# Patient Record
Sex: Female | Born: 1985 | Race: Black or African American | Hispanic: No | Marital: Married | Smoking: Never smoker
Health system: Southern US, Community
[De-identification: ages and names within clinical notes are randomized; demographics above are authoritative.]

## PROBLEM LIST (undated history)

## (undated) DIAGNOSIS — D649 Anemia, unspecified: Secondary | ICD-10-CM

## (undated) DIAGNOSIS — Z8759 Personal history of other complications of pregnancy, childbirth and the puerperium: Secondary | ICD-10-CM

## (undated) HISTORY — DX: Anemia, unspecified: D64.9

## (undated) HISTORY — DX: Personal history of other complications of pregnancy, childbirth and the puerperium: Z87.59

---

## 2011-09-24 DIAGNOSIS — O1490 Unspecified pre-eclampsia, unspecified trimester: Secondary | ICD-10-CM

## 2013-09-23 DIAGNOSIS — O1490 Unspecified pre-eclampsia, unspecified trimester: Secondary | ICD-10-CM

## 2013-09-23 DIAGNOSIS — O4443 Low lying placenta NOS or without hemorrhage, third trimester: Secondary | ICD-10-CM

## 2015-09-24 NOTE — L&D Delivery Note (Signed)
Delivery Summary for Allison Manning  Labor Events:   Preterm labor:   Rupture date:   Rupture time:   Rupture type: Intact  Fluid Color:   Induction:   Augmentation:   Complications:   Cervical ripening:          Delivery:   Episiotomy:   Lacerations:   Repair suture:   Repair # of packets:   Blood loss (ml): 500 ml   Information for the patient's newborn:  Allison Manning, Boy Allison Manning [[id number]][[id number]]    Delivery 03/28/2016 8:12 AM by  C-Section, Low Transverse Sex:  female Gestational Age: [id number] Delivery Clinician:  Hildred LaserAnika Aaiden Depoy Living?: Yes        APGARS  One minute Five minutes Ten minutes  Skin color: 1   1      Heart rate: 2   2      Grimace: 2   2      Muscle tone: 2   2      Breathing: 2   2      Totals: 9  9      Presentation/position:      Resuscitation: None  Cord information: 3 vessels   Disposition of cord blood: No    Blood gases sent? No Complications: None  Placenta: Delivered: 03/28/2016 8:15 AM  Manual removal  Intact appearance Newborn Measurements: Weight: 5 lb 2.2 oz (2330 g)  Height:    Head circumference:    Chest circumference:    Other providers: Delivery Nurse Transition RN Jillian C Maricle Christin Fudgearolyn E Wanek  Additional  information: Forceps:   Vacuum:   Breech:   Observed anomalies        See Dr. Oretha Milchherry's C-section operative note for details of procedure.     Hildred LaserAnika Maddyson Keil, MD [facility]

## 2015-10-10 ENCOUNTER — Ambulatory Visit (INDEPENDENT_AMBULATORY_CARE_PROVIDER_SITE_OTHER): Payer: Medicaid Other

## 2015-10-10 ENCOUNTER — Ambulatory Visit (INDEPENDENT_AMBULATORY_CARE_PROVIDER_SITE_OTHER): Payer: Medicaid Other | Admitting: Obstetrics and Gynecology

## 2015-10-10 ENCOUNTER — Encounter: Payer: Self-pay | Admitting: Obstetrics and Gynecology

## 2015-10-10 ENCOUNTER — Other Ambulatory Visit: Payer: Self-pay | Admitting: Obstetrics and Gynecology

## 2015-10-10 VITALS — BP 128/78 | HR 102 | Ht 65.0 in | Wt 143.1 lb

## 2015-10-10 DIAGNOSIS — Z369 Encounter for antenatal screening, unspecified: Secondary | ICD-10-CM

## 2015-10-10 DIAGNOSIS — Z331 Pregnant state, incidental: Secondary | ICD-10-CM

## 2015-10-10 DIAGNOSIS — Z36 Encounter for antenatal screening of mother: Secondary | ICD-10-CM

## 2015-10-10 NOTE — Progress Notes (Signed)
NOB physical, pt just moved from [place], "making a fresh start"

## 2015-10-11 LAB — CBC WITH DIFFERENTIAL/PLATELET
BASOS ABS: 0 10*3/uL (ref 0.0–0.2)
BASOS: 0 %
EOS (ABSOLUTE): 0 10*3/uL (ref 0.0–0.4)
Eos: 1 %
HEMOGLOBIN: 11.6 g/dL (ref 11.1–15.9)
Hematocrit: 34.4 % (ref 34.0–46.6)
IMMATURE GRANS (ABS): 0 10*3/uL (ref 0.0–0.1)
Immature Granulocytes: 0 %
LYMPHS: 25 %
Lymphocytes Absolute: 1.5 10*3/uL (ref 0.7–3.1)
MCH: 30.9 pg (ref 26.6–33.0)
MCHC: 33.7 g/dL (ref 31.5–35.7)
MCV: 92 fL (ref 79–97)
MONOCYTES: 5 %
Monocytes Absolute: 0.3 10*3/uL (ref 0.1–0.9)
NEUTROS ABS: 4.2 10*3/uL (ref 1.4–7.0)
Neutrophils: 69 %
Platelets: 333 10*3/uL (ref 150–379)
RBC: 3.75 x10E6/uL — ABNORMAL LOW (ref 3.77–5.28)
RDW: 13.9 % (ref 12.3–15.4)
WBC: 6.1 10*3/uL (ref 3.4–10.8)

## 2015-10-11 LAB — URINALYSIS, ROUTINE W REFLEX MICROSCOPIC
Bilirubin, UA: NEGATIVE
GLUCOSE, UA: NEGATIVE
KETONES UA: NEGATIVE
Leukocytes, UA: NEGATIVE
NITRITE UA: NEGATIVE
Protein, UA: NEGATIVE
RBC, UA: NEGATIVE
Specific Gravity, UA: 1.01 (ref 1.005–1.030)
UUROB: 0.2 mg/dL (ref 0.2–1.0)
pH, UA: 6.5 (ref 5.0–7.5)

## 2015-10-11 LAB — GC/CHLAMYDIA PROBE AMP
CHLAMYDIA, DNA PROBE: NEGATIVE
Neisseria gonorrhoeae by PCR: NEGATIVE

## 2015-10-11 LAB — HEP, RPR, HIV PANEL
HEP B S AG: NEGATIVE
HIV SCREEN 4TH GENERATION: NONREACTIVE
RPR: NONREACTIVE

## 2015-10-11 LAB — RUBELLA SCREEN: RUBELLA: 4.72 {index} (ref >0.99)

## 2015-10-11 LAB — SICKLE CELL SCREEN: SICKLE CELL SCREEN: NEGATIVE

## 2015-10-11 LAB — VARICELLA ZOSTER ANTIBODY, IGG: Varicella zoster IgG: 514 index (ref >165)

## 2015-10-11 LAB — ANTIBODY SCREEN: Antibody Screen: NEGATIVE

## 2015-10-11 LAB — URINE CULTURE

## 2015-10-11 LAB — THYROID PANEL WITH TSH
Free Thyroxine Index: 2.3 (ref 1.2–4.9)
T3 Uptake Ratio: 21 % — ABNORMAL LOW (ref 24–39)
T4 TOTAL: 10.8 ug/dL (ref 4.5–12.0)
TSH: 0.839 u[IU]/mL (ref 0.450–4.500)

## 2015-10-11 LAB — ABO AND RH: Rh Factor: POSITIVE

## 2015-10-11 LAB — CYTOLOGY - PAP

## 2015-10-11 LAB — URIC ACID: Uric Acid: 3.1 mg/dL (ref 2.5–7.1)

## 2015-10-18 ENCOUNTER — Telehealth: Payer: Self-pay | Admitting: Obstetrics and Gynecology

## 2015-10-18 NOTE — Telephone Encounter (Signed)
Discussed with pt, she voiced understanding

## 2015-10-18 NOTE — Telephone Encounter (Signed)
13 wks Spotting but slowing down this morn, enough to go on a panie liner  Call this number if you cant reach her on the home number [phone]

## 2015-10-21 LAB — FIRST TRIMESTER SCREEN W/NT
CRL: 51.9 mm
DIA MoM: 0.79
DIA Value: 215.4 pg/mL
GEST AGE-COLLECT: 11.9 wk
HCG MOM: 0.64
MATERNAL AGE AT EDD: 27.8 a
NUCHAL TRANSLUCENCY MOM: 0.86
NUCHAL TRANSLUCENCY: 1.2 mm
NUMBER OF FETUSES: 1
PAPP-A MoM: 1.5
PAPP-A VALUE: 1199.1 ng/mL
PDF: 0
Test Results:: NEGATIVE
Weight: 143 [lb_av]
hCG Value: 69.5 IU/mL

## 2015-11-07 ENCOUNTER — Other Ambulatory Visit: Payer: Self-pay | Admitting: Obstetrics and Gynecology

## 2015-11-07 ENCOUNTER — Encounter: Payer: Self-pay | Admitting: Obstetrics and Gynecology

## 2015-11-07 ENCOUNTER — Ambulatory Visit (INDEPENDENT_AMBULATORY_CARE_PROVIDER_SITE_OTHER): Payer: Medicaid Other | Admitting: Obstetrics and Gynecology

## 2015-11-07 VITALS — BP 138/79 | HR 103 | Wt 146.3 lb

## 2015-11-07 DIAGNOSIS — O09292 Supervision of pregnancy with other poor reproductive or obstetric history, second trimester: Secondary | ICD-10-CM

## 2015-11-07 DIAGNOSIS — O0992 Supervision of high risk pregnancy, unspecified, second trimester: Secondary | ICD-10-CM | POA: Insufficient documentation

## 2015-11-07 DIAGNOSIS — Z98891 History of uterine scar from previous surgery: Secondary | ICD-10-CM

## 2015-11-07 DIAGNOSIS — O09299 Supervision of pregnancy with other poor reproductive or obstetric history, unspecified trimester: Secondary | ICD-10-CM | POA: Insufficient documentation

## 2015-11-07 LAB — POCT URINALYSIS DIPSTICK
Bilirubin, UA: NEGATIVE
Blood, UA: NEGATIVE
Glucose, UA: NEGATIVE
Ketones, UA: NEGATIVE
LEUKOCYTES UA: NEGATIVE
NITRITE UA: NEGATIVE
PH UA: 7.5
PROTEIN UA: NEGATIVE
Spec Grav, UA: 1.01
Urobilinogen, UA: NEGATIVE

## 2015-11-07 NOTE — Patient Instructions (Signed)
Vaginal Birth After Cesarean Delivery Vaginal birth after cesarean delivery (VBAC) is giving birth vaginally after previously delivering a baby by a cesarean. In the past, if a woman had a cesarean delivery, all births afterward would be done by cesarean delivery. This is no longer true. It can be safe for the mother to try a vaginal delivery after having a cesarean delivery.  It is important to discuss VBAC with your health care provider early in the pregnancy so you can understand the risks, benefits, and options. It will give you time to decide what is best in your particular case. The final decision about whether to have a VBAC or repeat cesarean delivery should be between you and your health care provider. Any changes in your health or your baby's health during your pregnancy may make it necessary to change your initial decision about VBAC.  WOMEN WHO PLAN TO HAVE A VBAC SHOULD CHECK WITH THEIR HEALTH CARE PROVIDER TO BE SURE THAT:  The previous cesarean delivery was done with a low transverse uterine cut (incision) (not a vertical classical incision).   The birth canal is big enough for the baby.   There were no other operations on the uterus.   An electronic fetal monitor (EFM) will be on at all times during labor.   An operating room will be available and ready in case an emergency cesarean delivery is needed.   A health care provider and surgical nursing staff will be available at all times during labor to be ready to do an emergency delivery cesarean if necessary.   An anesthesiologist will be present in case an emergency cesarean delivery is needed.   The nursery is prepared and has adequate personnel and necessary equipment available to care for the baby in case of an emergency cesarean delivery. BENEFITS OF VBAC  Shorter stay in the hospital.   Avoidance of risks associated with cesarean delivery, such as:  Surgical complications, such as opening of the incision or  hernia in the incision.  Injury to other organs.  Fever. This can occur if an infection develops after surgery. It can also occur as a reaction to the medicine given to make you numb during the surgery.  Less blood loss and need for blood transfusions.  Lower risk of blood clots and infection.  Shorter recovery.   Decreased risk for having to remove the uterus (hysterectomy).   Decreased risk for the placenta to completely or partially cover the opening of the uterus (placenta previa) with a future pregnancy.   Decrease risk in future labor and delivery. RISKS OF A VBAC  Tearing (rupture) of the uterus. This is occurs in less than 1% of VBACs. The risk of this happening is higher if:  Steps are taken to begin the labor process (induce labor) or stimulate or strengthen contractions (augment labor).   Medicine is used to soften (ripen) the cervix.  Having to remove the uterus (hysterectomy) if it ruptures. VBAC SHOULD NOT BE DONE IF:  The previous cesarean delivery was done with a vertical (classical) or T-shaped incision or you do not know what kind of incision was made.   You had a ruptured uterus.   You have had certain types of surgery on your uterus, such as removal of uterine fibroids. Ask your health care provider about other types of surgeries that prevent you from having a VBAC.  You have certain medical or childbirth (obstetrical) problems.   There are problems with the baby.   You   have had two previous cesarean deliveries and no vaginal deliveries. OTHER FACTS TO KNOW ABOUT VBAC:  It is safe to have an epidural anesthetic with VBAC.   It is safe to turn the baby from a breech position (attempt an external cephalic version).   It is safe to try a VBAC with twins.   VBAC may not be successful if your baby weights 8.8 lb (4 kg) or more. However, weight predictions are not always accurate and should not be used alone to decide if VBAC is right for  you.  There is an increased failure rate if the time between the cesarean delivery and VBAC is less than 19 months.   Your health care provider may advise against a VBAC if you have preeclampsia (high blood pressure, protein in the urine, and swelling of face and extremities).   VBAC is often successful if you previously gave birth vaginally.   VBAC is often successful when the labor starts spontaneously before the due date.   Delivering a baby through a VBAC is similar to having a normal spontaneous vaginal delivery.   This information is not intended to replace advice given to you by your health care provider. Make sure you discuss any questions you have with your health care provider.   Document Released: 03/02/2007 Document Revised: 09/30/2014 Document Reviewed: 04/08/2013 Elsevier Interactive Patient Education 2016 Elsevier Inc.       Postpartum Tubal Ligation Postpartum tubal ligation (PPTL) is a procedure that closes the fallopian tubes right after childbirth or 1-2 days after childbirth. PPTL is done before the uterus returns to its normal location. The procedure is also called a mini-laparotomy. When the fallopian tubes are closed, the eggs that are released from the ovaries cannot enter the uterus, and sperm cannot reach the egg. PPTL is done so you will not be able to get pregnant or have a baby. Although this procedure may be undone (reversed), it should be considered permanent and irreversible. If you want to have future pregnancies, you should not have this procedure. LET [facility] CARE PROVIDER KNOW ABOUT:  Any allergies you have.  All medicines you are taking, including vitamins, herbs, eye drops, creams, and over-the-counter medicines. This includes any use of steroids, either by mouth or in cream form.  Previous problems you or members of your family have had with the use of anesthetics.  Any blood disorders you have.  Previous surgeries you have  had.  Any medical conditions you may have. RISKS AND COMPLICATIONS  Infection.  Bleeding.  Injury to surrounding organs.  Side effects from anesthetics.  Failure of the procedure.  Ectopic pregnancy.  Future regret about having the procedure done. BEFORE THE PROCEDURE  You may need to sign certain documents, including an informed consent form, up to 30 days before the date of your tubal ligation.  Follow instructions from your health care provider about eating and drinking restrictions. PROCEDURE  If done 1-2 days after a vaginal delivery:  You will be given one or more of the following:  A medicine that helps you relax (sedative).  A medicine that numbs the area (local anesthetic).  A medicine that makes you fall asleep (general anesthetic).  A medicine that is injected into an area of your body that numbs everything below the injection site (regional anesthetic).  If you have been given general anesthetic, a tube will be put down your throat to help you breathe.  Your bladder may be emptied with a small tube (catheter).  A small cut (incision) will be made just above the pubic hair line.  The fallopian tubes will be located and brought up through the incision.  The fallopian tubes will be tied off or burned (cauterized), or they will be closed with a clamp, ring, or clip. In many cases, a small portion in the center of each fallopian tube will also be removed.  The incision will be closed with stitches (sutures).  A bandage (dressing) will be placed over the incision. The procedure may vary among health care providers and hospitals. If done after a cesarean delivery:  Tubal ligation will be done through the incision that was used for the cesarean delivery of your baby.  After the tubes are closed, the incision will be closed with stitches (sutures).  A bandage (dressing) will be placed over the incision. The procedure may vary among health care providers and  hospitals. AFTER THE PROCEDURE  Your blood pressure, heart rate, breathing rate, and blood oxygen level will be monitored often until the medicines you were given have worn off.  You will be given pain medicine as needed.  If you had general anesthetic, you may have some mild discomfort in your throat. This is from the breathing tube that was placed in your throat while you were sleeping.  You may feel tired, and you should rest for the remainder of the day.  You may have some pain or cramps in the abdominal area for 3-7 days.   This information is not intended to replace advice given to you by your health care provider. Make sure you discuss any questions you have with your health care provider.   Document Released: 09/09/2005 Document Revised: 01/24/2015 Document Reviewed: 12/21/2011 Elsevier Interactive Patient Education [employer]

## 2015-11-07 NOTE — Progress Notes (Signed)
ROB: Allison Manning is a [id number] female who presents for OB care.  Was referred from [facility] due to high risk status.  Patient has h/o pre-eclampsia in last 2 pregnancies, as well as low-lying placenta vs previa in third pregnancy requiring Cesarean delivery.  Last 2 infants SGA. Also notes excessive bleeding/hemorrhage during C-section.  Has recently moved from [place] where all 3 prior deliveries occurred.  Denies complaints today.  Discussion had regarding VBAC vs TOLAC, h/o pre-eclampsia (and need to begin daily baby asipirin 81 mg).  Patient also notes that she desires BTL for contraception.  Will sign papers at 26-28 weeks. Will complete remainder of baseline PIH labs today.  For anatomy scan within 1 week. For MSAFP today. RTC in 4 weeks.

## 2015-11-08 LAB — PROTEIN / CREATININE RATIO, URINE
Creatinine, Urine: 176.6 mg/dL
PROTEIN UR: 16.4 mg/dL
PROTEIN/CREAT RATIO: 93 mg/g{creat} (ref 0–200)

## 2015-11-09 LAB — COMPREHENSIVE METABOLIC PANEL
A/G RATIO: 1.2 (ref 1.1–2.5)
ALT: 7 IU/L (ref 0–32)
AST: 8 IU/L (ref 0–40)
Albumin: 3.7 g/dL (ref 3.5–5.5)
Alkaline Phosphatase: 53 IU/L (ref 39–117)
BUN/Creatinine Ratio: 12 (ref 8–20)
BUN: 8 mg/dL (ref 6–20)
Bilirubin Total: <0.2 mg/dL (ref 0.0–1.2)
CALCIUM: 9.1 mg/dL (ref 8.7–10.2)
CO2: 23 mmol/L (ref 18–29)
CREATININE: 0.69 mg/dL (ref 0.57–1.00)
Chloride: 100 mmol/L (ref 96–106)
GFR calc Af Amer: 138 mL/min/{1.73_m2} (ref >59)
GFR, EST NON AFRICAN AMERICAN: 120 mL/min/{1.73_m2} (ref >59)
Globulin, Total: 3 g/dL (ref 1.5–4.5)
Glucose: 73 mg/dL (ref 65–99)
POTASSIUM: 4 mmol/L (ref 3.5–5.2)
Sodium: 137 mmol/L (ref 134–144)
Total Protein: 6.7 g/dL (ref 6.0–8.5)

## 2015-11-09 LAB — AFP, SERUM, OPEN SPINA BIFIDA
AFP MOM: 1.06
AFP VALUE AFPOSL: 57.3 ng/mL
Gest. Age on Collection Date: 19 weeks
MATERNAL AGE AT EDD: 27.7 a
OSBR Risk 1 IN: 10000
PDF: 0
Test Results:: NEGATIVE
WEIGHT: 146 [lb_av]

## 2015-11-14 ENCOUNTER — Ambulatory Visit (INDEPENDENT_AMBULATORY_CARE_PROVIDER_SITE_OTHER): Payer: Medicaid Other

## 2015-11-14 DIAGNOSIS — O0992 Supervision of high risk pregnancy, unspecified, second trimester: Secondary | ICD-10-CM | POA: Diagnosis not present

## 2015-12-01 ENCOUNTER — Other Ambulatory Visit: Payer: Self-pay | Admitting: Obstetrics and Gynecology

## 2015-12-01 DIAGNOSIS — Z0489 Encounter for examination and observation for other specified reasons: Secondary | ICD-10-CM

## 2015-12-01 DIAGNOSIS — IMO0002 Reserved for concepts with insufficient information to code with codable children: Secondary | ICD-10-CM

## 2015-12-01 DIAGNOSIS — O4402 Placenta previa specified as without hemorrhage, second trimester: Secondary | ICD-10-CM

## 2015-12-05 ENCOUNTER — Ambulatory Visit (INDEPENDENT_AMBULATORY_CARE_PROVIDER_SITE_OTHER): Payer: Medicaid Other

## 2015-12-05 ENCOUNTER — Ambulatory Visit (INDEPENDENT_AMBULATORY_CARE_PROVIDER_SITE_OTHER): Payer: Medicaid Other | Admitting: Obstetrics and Gynecology

## 2015-12-05 ENCOUNTER — Encounter: Payer: Self-pay | Admitting: Obstetrics and Gynecology

## 2015-12-05 VITALS — BP 135/87 | HR 85 | Wt 150.7 lb

## 2015-12-05 DIAGNOSIS — O0992 Supervision of high risk pregnancy, unspecified, second trimester: Secondary | ICD-10-CM

## 2015-12-05 DIAGNOSIS — IMO0002 Reserved for concepts with insufficient information to code with codable children: Secondary | ICD-10-CM

## 2015-12-05 DIAGNOSIS — O4412 Placenta previa with hemorrhage, second trimester: Secondary | ICD-10-CM

## 2015-12-05 DIAGNOSIS — O4402 Placenta previa specified as without hemorrhage, second trimester: Secondary | ICD-10-CM

## 2015-12-05 DIAGNOSIS — Z36 Encounter for antenatal screening of mother: Secondary | ICD-10-CM

## 2015-12-05 DIAGNOSIS — Z98891 History of uterine scar from previous surgery: Secondary | ICD-10-CM

## 2015-12-05 DIAGNOSIS — O09292 Supervision of pregnancy with other poor reproductive or obstetric history, second trimester: Secondary | ICD-10-CM

## 2015-12-05 DIAGNOSIS — Z0489 Encounter for examination and observation for other specified reasons: Secondary | ICD-10-CM

## 2015-12-05 DIAGNOSIS — O4403 Placenta previa specified as without hemorrhage, third trimester: Secondary | ICD-10-CM | POA: Insufficient documentation

## 2015-12-05 LAB — POCT URINALYSIS DIPSTICK
Bilirubin, UA: NEGATIVE
Blood, UA: NEGATIVE
GLUCOSE UA: NEGATIVE
Ketones, UA: NEGATIVE
LEUKOCYTES UA: NEGATIVE
NITRITE UA: NEGATIVE
PROTEIN UA: NEGATIVE
Spec Grav, UA: 1.01
UROBILINOGEN UA: NEGATIVE
pH, UA: 8

## 2015-12-05 NOTE — Progress Notes (Signed)
ROB: Patient s/p anatomy scan which notes complete previa.  Advised on checking placenta again at 30 weeks.  If previa still noted, would recommend repeat C-section at that time (as [facility]  Discussed pelvic rest until next scan. Elevated BP today, repeat normal. Patient with h/o pre-eclampsia in prior pregnancies, will continue to closely monitor.  Taking daily baby aspirin as prescribed.  Discussed contraception again with patient. Still desires BTL. Papers signed today. Given handouts on postpartum sterilization vs Essure. Normal AFP.  RTC in 4 weeks.  For 28 week labs and Tdap at that time.

## 2016-01-02 ENCOUNTER — Ambulatory Visit (INDEPENDENT_AMBULATORY_CARE_PROVIDER_SITE_OTHER): Payer: Medicaid Other | Admitting: Obstetrics and Gynecology

## 2016-01-02 ENCOUNTER — Other Ambulatory Visit: Payer: Medicaid Other

## 2016-01-02 VITALS — BP 145/92 | HR 96 | Wt 156.0 lb

## 2016-01-02 DIAGNOSIS — O0992 Supervision of high risk pregnancy, unspecified, second trimester: Secondary | ICD-10-CM

## 2016-01-02 DIAGNOSIS — O4402 Placenta previa specified as without hemorrhage, second trimester: Secondary | ICD-10-CM

## 2016-01-02 DIAGNOSIS — Z98891 History of uterine scar from previous surgery: Secondary | ICD-10-CM

## 2016-01-02 DIAGNOSIS — O09292 Supervision of pregnancy with other poor reproductive or obstetric history, second trimester: Secondary | ICD-10-CM

## 2016-01-02 DIAGNOSIS — O4412 Placenta previa with hemorrhage, second trimester: Secondary | ICD-10-CM

## 2016-01-02 LAB — POCT URINALYSIS DIPSTICK
Bilirubin, UA: NEGATIVE
Blood, UA: NEGATIVE
Glucose, UA: NEGATIVE
KETONES UA: NEGATIVE
LEUKOCYTES UA: NEGATIVE
Nitrite, UA: NEGATIVE
PROTEIN UA: NEGATIVE
Spec Grav, UA: 1.02
Urobilinogen, UA: NEGATIVE
pH, UA: 6.5

## 2016-01-02 NOTE — Progress Notes (Signed)
ROB: Patient doing well, notes occasional hot flushes. For 28 weeks labs today.  Discussed cord blood banking.  Desires BTL, papers signed last visit.  Desires to breastfeed.  Labile BPs, likely with gestational HTN.  Has h/o pre-eclampsia in prior pregnancies.  Still desiring TOLAC.  RTC in 2 weeks. For growth scan and f/u complete placenta previa.

## 2016-01-03 LAB — GLUCOSE, 1 HOUR GESTATIONAL: Gestational Diabetes Screen: 105 mg/dL (ref 65–139)

## 2016-01-03 LAB — HEMOGLOBIN AND HEMATOCRIT, BLOOD
Hematocrit: 33.7 % — ABNORMAL LOW (ref 34.0–46.6)
Hemoglobin: 11.4 g/dL (ref 11.1–15.9)

## 2016-01-17 ENCOUNTER — Ambulatory Visit (INDEPENDENT_AMBULATORY_CARE_PROVIDER_SITE_OTHER): Payer: Medicaid Other | Admitting: Obstetrics and Gynecology

## 2016-01-17 ENCOUNTER — Encounter: Payer: Self-pay | Admitting: Obstetrics and Gynecology

## 2016-01-17 VITALS — BP 140/89 | HR 116 | Wt 155.5 lb

## 2016-01-17 DIAGNOSIS — O09292 Supervision of pregnancy with other poor reproductive or obstetric history, second trimester: Secondary | ICD-10-CM

## 2016-01-17 DIAGNOSIS — O133 Gestational [pregnancy-induced] hypertension without significant proteinuria, third trimester: Secondary | ICD-10-CM | POA: Insufficient documentation

## 2016-01-17 DIAGNOSIS — O0992 Supervision of high risk pregnancy, unspecified, second trimester: Secondary | ICD-10-CM

## 2016-01-17 DIAGNOSIS — O132 Gestational [pregnancy-induced] hypertension without significant proteinuria, second trimester: Secondary | ICD-10-CM

## 2016-01-17 LAB — POCT URINALYSIS DIPSTICK
Bilirubin, UA: NEGATIVE
GLUCOSE UA: NEGATIVE
Ketones, UA: NEGATIVE
Leukocytes, UA: NEGATIVE
NITRITE UA: NEGATIVE
PH UA: 6
PROTEIN UA: NEGATIVE
RBC UA: NEGATIVE
Spec Grav, UA: 1.02
UROBILINOGEN UA: NEGATIVE

## 2016-01-17 MED ORDER — RANITIDINE HCL 75 MG PO TABS: 75.0000 mg | ORAL_TABLET | Freq: Two times a day (BID) | ORAL | Status: DC

## 2016-01-17 NOTE — Progress Notes (Signed)
ROB: Notes checking BPs at home and are normal.  BPs ino office are mildly elevated.  C/o heartburn, mild in morning, worse at night. Will prescribe Zantac BID.  Dating noted to be incorrect in EPIC.  Corrected today, patient currently on 26 weeks.  RTC in 2 weeks.  Continue to monitor BPs, currently with GHTN, and with previous h/o pre-eclampsia in prior pregnancies.

## 2016-01-30 ENCOUNTER — Encounter: Payer: Medicaid Other | Admitting: Obstetrics and Gynecology

## 2016-01-31 ENCOUNTER — Ambulatory Visit (INDEPENDENT_AMBULATORY_CARE_PROVIDER_SITE_OTHER): Payer: Medicaid Other | Admitting: Obstetrics and Gynecology

## 2016-01-31 VITALS — BP 125/74 | HR 48 | Wt 157.0 lb

## 2016-01-31 DIAGNOSIS — Z3483 Encounter for supervision of other normal pregnancy, third trimester: Secondary | ICD-10-CM

## 2016-01-31 DIAGNOSIS — O4412 Placenta previa with hemorrhage, second trimester: Secondary | ICD-10-CM

## 2016-01-31 DIAGNOSIS — O09293 Supervision of pregnancy with other poor reproductive or obstetric history, third trimester: Secondary | ICD-10-CM

## 2016-01-31 DIAGNOSIS — O0992 Supervision of high risk pregnancy, unspecified, second trimester: Secondary | ICD-10-CM

## 2016-01-31 DIAGNOSIS — Z98891 History of uterine scar from previous surgery: Secondary | ICD-10-CM

## 2016-01-31 DIAGNOSIS — O132 Gestational [pregnancy-induced] hypertension without significant proteinuria, second trimester: Secondary | ICD-10-CM

## 2016-01-31 DIAGNOSIS — O4402 Placenta previa specified as without hemorrhage, second trimester: Secondary | ICD-10-CM

## 2016-01-31 DIAGNOSIS — Z3493 Encounter for supervision of normal pregnancy, unspecified, third trimester: Secondary | ICD-10-CM

## 2016-01-31 LAB — POCT URINALYSIS DIPSTICK
BILIRUBIN UA: NEGATIVE
Blood, UA: NEGATIVE
GLUCOSE UA: NEGATIVE
KETONES UA: NEGATIVE
LEUKOCYTES UA: NEGATIVE
Nitrite, UA: NEGATIVE
PH UA: 6.5
Protein, UA: NEGATIVE
Spec Grav, UA: <=1.005
Urobilinogen, UA: NEGATIVE

## 2016-01-31 NOTE — Progress Notes (Signed)
ROB: Patient complains of pelvic cramping.  Advised on Tylenol, warm baths.  BPs wnl today, repeat pulse 64.  For growth scan and recheck placenta in 2 weeks (for complete previa).  Declines Tdap today, desires to think about it.  Blood consents and BTL papers previously signed.  Discussed cord blood banking.

## 2016-02-14 ENCOUNTER — Ambulatory Visit (INDEPENDENT_AMBULATORY_CARE_PROVIDER_SITE_OTHER): Payer: Medicaid Other

## 2016-02-14 DIAGNOSIS — O4412 Placenta previa with hemorrhage, second trimester: Secondary | ICD-10-CM | POA: Diagnosis not present

## 2016-02-14 DIAGNOSIS — O09292 Supervision of pregnancy with other poor reproductive or obstetric history, second trimester: Secondary | ICD-10-CM | POA: Diagnosis not present

## 2016-02-14 DIAGNOSIS — O4402 Placenta previa specified as without hemorrhage, second trimester: Secondary | ICD-10-CM

## 2016-02-20 ENCOUNTER — Encounter: Payer: Self-pay | Admitting: Obstetrics and Gynecology

## 2016-02-20 ENCOUNTER — Ambulatory Visit (INDEPENDENT_AMBULATORY_CARE_PROVIDER_SITE_OTHER): Payer: Medicaid Other | Admitting: Obstetrics and Gynecology

## 2016-02-20 VITALS — BP 144/88 | HR 101 | Wt 159.9 lb

## 2016-02-20 DIAGNOSIS — O133 Gestational [pregnancy-induced] hypertension without significant proteinuria, third trimester: Secondary | ICD-10-CM

## 2016-02-20 DIAGNOSIS — Z8759 Personal history of other complications of pregnancy, childbirth and the puerperium: Secondary | ICD-10-CM

## 2016-02-20 DIAGNOSIS — O0993 Supervision of high risk pregnancy, unspecified, third trimester: Secondary | ICD-10-CM

## 2016-02-20 DIAGNOSIS — Z23 Encounter for immunization: Secondary | ICD-10-CM

## 2016-02-20 DIAGNOSIS — O4403 Placenta previa specified as without hemorrhage, third trimester: Secondary | ICD-10-CM

## 2016-02-20 DIAGNOSIS — O4413 Placenta previa with hemorrhage, third trimester: Secondary | ICD-10-CM

## 2016-02-20 LAB — POCT URINALYSIS DIPSTICK
Bilirubin, UA: NEGATIVE
GLUCOSE UA: NEGATIVE
KETONES UA: NEGATIVE
Leukocytes, UA: NEGATIVE
Nitrite, UA: NEGATIVE
Protein, UA: NEGATIVE
RBC UA: NEGATIVE
SPEC GRAV UA: 1.01
Urobilinogen, UA: NEGATIVE
pH, UA: 7

## 2016-02-20 NOTE — Patient Instructions (Signed)
Placenta Previa Placenta previa is a condition in pregnant women where the placenta implants in the lower part of the uterus. The placenta either partially or completely covers the opening to the cervix. This is a problem because the baby must pass through the cervix during delivery. There are three types of placenta previa. They include:  1. Marginal placenta previa. The placenta is near the cervix, but does not cover the opening. 2. Partial placenta previa. The placenta covers part of the cervical opening. 3. Complete placenta previa. The placenta covers the entire cervical opening.  Depending on the type of placenta previa, there is a chance the placenta may move into a normal position and no longer cover the cervix as the pregnancy progresses. It is important to keep all prenatal visits with your caregiver.  RISK FACTORS You may be more likely to develop placenta previa if you:   Are carrying more than one baby (multiples).   Have an abnormally shaped uterus.   Have scars on the lining of the uterus.   Had previous surgeries involving the uterus, such as a cesarean delivery.   Have delivered a baby previously.   Have a history of placenta previa.   Have smoked or used cocaine during pregnancy.   Are age 35 or older during pregnancy.  SYMPTOMS The main symptom of placenta previa is sudden, painless vaginal bleeding during the second half of pregnancy. The amount of bleeding can be light to very heavy. The bleeding may stop on its own, but almost always returns. Cramping, regular contractions, abdominal pain, and lower back pain can also occur with placenta previa.  DIAGNOSIS Placenta previa can be diagnosed through an ultrasound by finding where the placenta is located. The ultrasound may find placenta previa either during a routine prenatal visit or after vaginal bleeding is noticed. If you are diagnosed with placenta previa, your caregiver may avoid vaginal exams to reduce  the risk of heavy bleeding. There is a chance that placenta previa may not be diagnosed until bleeding occurs during labor.  TREATMENT Specific treatment depends on:   How much you are bleeding or if the bleeding has stopped.  How far along you are in your pregnancy.   The condition of the baby.   The location of the baby and placenta.   The type of placenta previa.  Depending on the factors above, your caregiver may recommend:   Decreased activity.   Bed rest at home or in the hospital.  Pelvic rest. This means no sex, using tampons, douching, pelvic exams, or placing anything into the vagina.  A blood transfusion to replace maternal blood loss.  A cesarean delivery if the bleeding is heavy and cannot be controlled or the placenta completely covers the cervix.  Medication to stop premature labor or mature the fetal lungs if delivery is needed before the pregnancy is full term.  WHEN SHOULD YOU SEEK IMMEDIATE MEDICAL CARE IF YOU ARE SENT HOME WITH PLACENTA PREVIA? Seek immediate medical care if you show any symptoms of placenta previa. You will need to go to the hospital to get checked immediately. Again, those symptoms are:  Sudden, painless vaginal bleeding, even a small amount.  Cramping or regular contractions.  Lower back or abdominal pain.   This information is not intended to replace advice given to you by your health care provider. Make sure you discuss any questions you have with your health care provider.   Document Released: 09/09/2005 Document Revised: 09/30/2014 Document Reviewed: 12/11/2012 Elsevier   Interactive Patient Education 2016 Elsevier Inc.  

## 2016-02-20 NOTE — Progress Notes (Signed)
ROB: Patient s/p f/u scan for placental location, still notes complete placenta previa.  Discussed that based on this, patient is no longer a TOLAC candidate, and will need repeat C-section.  Will continue to follow placental location during growth scans for GHTN.  To begin antenatal testing at 32 weeks (next visit). Last growth 28%ile.  Patient with h/o SGA infant [facility]. Next scan in 3 weeks.  Declines Tdap.

## 2016-02-26 ENCOUNTER — Telehealth: Payer: Self-pay | Admitting: *Deleted

## 2016-02-26 NOTE — Telephone Encounter (Signed)
Called pt and talked to her about her transportation options. Patient is aware of her options. I put her back on the schedule for 03/07/15 @ 11:45. Patient states she will try to make the appt.

## 2016-02-26 NOTE — Telephone Encounter (Signed)
This pt has medicaid and should contact her Child psychotherapistsocial worker or the [employer] [facility] which can provide her with transportation. I do not recommend the pt wait until the end of the month.

## 2016-02-26 NOTE — Telephone Encounter (Signed)
Patient called and neeed to R/S her 2wk ROB. She had an appt on 03/06/16. Patient is having transportation issues. She can only come on 03/07/16,03/20/16, 03/21/16. The shedule is full for the days of 03/07/16. I didn't know if she needed to wait until 03/20/16 or 03/21/16. Please advise where to schedule. Thanks

## 2016-03-04 ENCOUNTER — Observation Stay
Admission: EM | Admit: 2016-03-04 | Discharge: 2016-03-05 | Disposition: A | Discharge status: Home / Self Care | Payer: Medicaid Other | Attending: Obstetrics and Gynecology | Admitting: Obstetrics and Gynecology

## 2016-03-04 ENCOUNTER — Encounter: Payer: Self-pay | Admitting: *Deleted

## 2016-03-04 ENCOUNTER — Observation Stay: Payer: Medicaid Other

## 2016-03-04 DIAGNOSIS — O4403 Placenta previa specified as without hemorrhage, third trimester: Secondary | ICD-10-CM

## 2016-03-04 DIAGNOSIS — O0992 Supervision of high risk pregnancy, unspecified, second trimester: Secondary | ICD-10-CM

## 2016-03-04 DIAGNOSIS — Z3A32 32 weeks gestation of pregnancy: Secondary | ICD-10-CM | POA: Diagnosis not present

## 2016-03-04 DIAGNOSIS — Z98891 History of uterine scar from previous surgery: Secondary | ICD-10-CM

## 2016-03-04 DIAGNOSIS — O4693 Antepartum hemorrhage, unspecified, third trimester: Secondary | ICD-10-CM

## 2016-03-04 DIAGNOSIS — Z7982 Long term (current) use of aspirin: Secondary | ICD-10-CM | POA: Insufficient documentation

## 2016-03-04 DIAGNOSIS — O34219 Maternal care for unspecified type scar from previous cesarean delivery: Secondary | ICD-10-CM | POA: Diagnosis not present

## 2016-03-04 DIAGNOSIS — O133 Gestational [pregnancy-induced] hypertension without significant proteinuria, third trimester: Secondary | ICD-10-CM | POA: Insufficient documentation

## 2016-03-04 DIAGNOSIS — O09893 Supervision of other high risk pregnancies, third trimester: Secondary | ICD-10-CM

## 2016-03-04 DIAGNOSIS — O4413 Placenta previa with hemorrhage, third trimester: Principal | ICD-10-CM | POA: Insufficient documentation

## 2016-03-04 DIAGNOSIS — N938 Other specified abnormal uterine and vaginal bleeding: Secondary | ICD-10-CM

## 2016-03-04 DIAGNOSIS — O132 Gestational [pregnancy-induced] hypertension without significant proteinuria, second trimester: Secondary | ICD-10-CM

## 2016-03-04 DIAGNOSIS — O09213 Supervision of pregnancy with history of pre-term labor, third trimester: Secondary | ICD-10-CM

## 2016-03-04 DIAGNOSIS — O4690 Antepartum hemorrhage, unspecified, unspecified trimester: Secondary | ICD-10-CM | POA: Diagnosis present

## 2016-03-04 LAB — CBC
HCT: 34.3 % — ABNORMAL LOW (ref 35.0–47.0)
HEMOGLOBIN: 11.5 g/dL — AB (ref 12.0–16.0)
MCH: 31.2 pg (ref 26.0–34.0)
MCHC: 33.6 g/dL (ref 32.0–36.0)
MCV: 92.7 fL (ref 80.0–100.0)
Platelets: 301 10*3/uL (ref 150–440)
RBC: 3.7 MIL/uL — AB (ref 3.80–5.20)
RDW: 13.1 % (ref 11.5–14.5)
WBC: 9.2 10*3/uL (ref 3.6–11.0)

## 2016-03-04 LAB — TYPE AND SCREEN
ABO/RH(D): O POS
ANTIBODY SCREEN: NEGATIVE

## 2016-03-04 LAB — CHLAMYDIA/NGC RT PCR (ARMC ONLY)
CHLAMYDIA TR: NOT DETECTED
N gonorrhoeae: NOT DETECTED

## 2016-03-04 LAB — RAPID HIV SCREEN (HIV 1/2 AB+AG)
HIV 1/2 ANTIBODIES: NONREACTIVE
HIV-1 P24 Antigen - HIV24: NONREACTIVE

## 2016-03-04 MED ORDER — BETAMETHASONE SOD PHOS & ACET 6 (3-3) MG/ML IJ SUSP
12.0000 mg | Freq: Once | INTRAMUSCULAR | Status: AC
  Administered 2016-03-04: 12 mg via INTRAMUSCULAR
  Filled 2016-03-04: qty 2

## 2016-03-04 MED ORDER — BETAMETHASONE SOD PHOS & ACET 6 (3-3) MG/ML IJ SUSP
12.0000 mg | Freq: Once | INTRAMUSCULAR | Status: AC
  Administered 2016-03-05: 12 mg via INTRAMUSCULAR

## 2016-03-04 MED ORDER — LACTATED RINGERS IV SOLN
INTRAVENOUS | Status: DC
  Administered 2016-03-04 – 2016-03-05 (×4): via INTRAVENOUS

## 2016-03-04 MED ORDER — CALCIUM CARBONATE ANTACID 500 MG PO CHEW: 2.0000 | CHEWABLE_TABLET | ORAL | Status: DC | PRN

## 2016-03-04 MED ORDER — TERBUTALINE SULFATE 1 MG/ML IJ SOLN
0.2500 mg | Freq: Once | INTRAMUSCULAR | Status: AC
  Administered 2016-03-04: 0.25 mg via SUBCUTANEOUS

## 2016-03-04 MED ORDER — PRENATAL MULTIVITAMIN CH
1.0000 | ORAL_TABLET | Freq: Every day | ORAL | Status: DC
  Administered 2016-03-04: 1 via ORAL
  Filled 2016-03-04: qty 1

## 2016-03-04 MED ORDER — DOCUSATE SODIUM 100 MG PO CAPS: 100.0000 mg | ORAL_CAPSULE | Freq: Every day | ORAL | Status: DC

## 2016-03-04 MED ORDER — TERBUTALINE SULFATE 1 MG/ML IJ SOLN
0.2500 mg | INTRAMUSCULAR | Status: DC | PRN
  Filled 2016-03-04: qty 1

## 2016-03-04 MED ORDER — ZOLPIDEM TARTRATE 5 MG PO TABS: 5.0000 mg | ORAL_TABLET | Freq: Every evening | ORAL | Status: DC | PRN

## 2016-03-04 NOTE — OB Triage Note (Signed)
Bright red bleeding, started about 0630 AM, woke patient from sleep. Reports CTX 7-8 mins. Starting yesterday at 2100. CTX stopped after she went to sleep last night. Positive fetal movement. No leaking fluid other than bleeding. Also reports HX of preeclampsia with this pregnancy. Denies HA, blurry vision or epigastric pain at this time. Previous section for placenta previa

## 2016-03-04 NOTE — H&P (Signed)
Obstetric History and Physical  Allison Manning is a 30 y.o. [phone] with IUP at 4972w5d with GHTN and complete placenta previa presenting for moderate amount of bright red vaginal bleeding since 0630 a.m.  Patient states she has been having  irregular contractions since 2100 last night, intact membranes, with active fetal movement.   Of note, patient has a h/o prior preterm delivery, prior C-section x  1, and h/o pre-eclampsia in prior 2 pregnancies.  Denies recent coitus.   Prenatal Course Source of Care: [facility]  with onset of care at 15 weeks Pregnancy complications or risks: Patient Active Problem List   Diagnosis Date Noted  . History of preterm delivery, currently pregnant in third trimester 03/04/2016  . Vaginal bleeding in pregnancy 03/04/2016  . Need for Tdap vaccination 02/20/2016  . Gestational hypertension w/o significant proteinuria in 2nd trimester 01/17/2016  . Placenta previa antepartum in third trimester 12/05/2015  . History of postpartum hemorrhage, currently pregnant 11/07/2015  . H/O cesarean section 11/07/2015  . H/O pre-eclampsia in prior pregnancy, currently pregnant 11/07/2015  . Supervision of high risk pregnancy in second trimester 11/07/2015   She plans to breastfeed She desires bilateral tubal ligation for postpartum contraception.   Prenatal labs and studies: ABO, Rh: --/--/O POS (06/12 45400741) Antibody: NEG (06/12 0741) Rubella: 4.72 (01/17 1051) RPR: Non Reactive (01/17 1051)  HBsAg: Negative (01/17 1051)  HIV: Non Reactive (01/17 1051)  GBS: unkown 1 hr Glucola normal (105) Genetic screening normal Anatomy US normal, except with complete placenta previa noted on 20 week and 28 week scan.     OB History  Gravida Para Term Preterm AB SAB TAB Ectopic Multiple Living  4 3 2 1      3     # Outcome Date GA Lbr Len/2nd Weight Sex Delivery Anes PTL Lv  4 Current           3 Preterm 2015 3566w0d  4 lb 4 oz (1.928 kg) M CS-Unspec Spinal  Y   Complications: Other Excessive Bleeding,Pre-eclampsia,Low lying placenta nos or without hemorrhage, third trimester  2 Term 2013 5376w0d  3 lb 4 oz (1.474 kg) M Vag-Spont EPI  Y     Complications: Pre-eclampsia  1 Term 2009 8015w0d  6 lb 9 oz (2.977 kg) M Vag-Spont EPI  Y    Obstetric Comments  H/o postpartum hemorrhage in G3 pregnancy (and possible abruption?). Had low-lying placenta.     Past Medical History  Diagnosis Date  . History of pre-eclampsia     Past Surgical History  Procedure Laterality Date  . Cesarean section      low lying placentia    No family history on file.   Social History   Social History  . Marital Status: Married    Spouse Name: N/A  . Number of Children: N/A  . Years of Education: N/A   Social History Main Topics  . Smoking status: Never Smoker   . Smokeless tobacco: Never Used  . Alcohol Use: No  . Drug Use: No  . Sexual Activity: Yes    Birth Control/ Protection: None   Other Topics Concern  . Not on file   Social History Narrative    Prescriptions prior to admission  Medication Sig Dispense Refill Last Dose  . aspirin EC 81 MG tablet Take 81 mg by mouth daily.   Taking  . Prenatal Vit-Fe Fumarate-FA (PRENATAL MULTIVITAMIN) TABS tablet Take 1 tablet by mouth daily at 12 noon.   Taking  .  ranitidine (ZANTAC 75) 75 MG tablet Take 1 tablet (75 mg total) by mouth 2 (two) times daily. 60 tablet 3 Taking    Allergies  Allergen Reactions  . Percocet [Oxycodone-Acetaminophen] Swelling    Review of Systems: Negative except for what is mentioned in HPI.  Physical Exam: BP 124/82 mmHg  Pulse 100  LMP 06/27/2015 (LMP Unknown) CONSTITUTIONAL: Well-developed, well-nourished female in no acute distress.  HENT:  Normocephalic, atraumatic, External right and left ear normal. Oropharynx is clear and moist EYES: Conjunctivae and EOM are normal. Pupils are equal, round, and reactive to light. No scleral icterus.  NECK: Normal range of motion,  supple, no masses SKIN: Skin is warm and dry. No rash noted. Not diaphoretic. No erythema. No pallor. NEUROLOGIC: Alert and oriented to person, place, and time. Normal reflexes, muscle tone coordination. No cranial nerve deficit noted. PSYCHIATRIC: Normal mood and affect. Normal behavior. Normal judgment and thought content. CARDIOVASCULAR: Normal heart rate noted, regular rhythm RESPIRATORY: Effort and breath sounds normal, no problems with respiration noted ABDOMEN: Soft, nontender, nondistended, gravid. MUSCULOSKELETAL: Normal range of motion. No edema and no tenderness. 2+ distal pulses.  Cervical Exam: Closed on speculum exam. Small amount of dark red blood in vaginal vault.  Presentation: cephalic (asynclitic) FHT:  Baseline rate 130 bpm   Variability moderate  Accelerations absent   Decelerations none Contractions: Every 7-8 mins   Pertinent Labs/Studies:   Results for orders placed or performed during the hospital encounter of 03/04/16 (from the past 24 hour(s))  Type and screen     Status: None (Preliminary result)   Collection Time: 03/04/16  7:41 AM  Result Value Ref Range   ABO/RH(D) O POS    Antibody Screen NEG    Sample Expiration 03/07/2016     Imaging:  Brief bedside sono performed with complete previa seen, no evidence of abruption, fetus in cephalic (but asynclitic) presentation), adequate fluid.   Assessment : Allison Manning is a 30 y.o. [phone] at [redacted]w[redacted]d being admitted for vaginal bleeding with h/o complete placenta previa and GHTN this pregnancy, h/o prior preterm delivery, prior C-section x  1, and h/o pre-eclampsia in prior 2 pregnancies.   Plan: 1. Admit to observation 2. Will get official sono for weight AFI, and placenta assessment.  3. For course of antenatal steroids.  4. Type and screen, CBC ordered.  Patient is O+, does not require Rhogam.  5. Given LR bolus, and continue to maintain IVF 6. For dose of terbutaline, prn for contractions 7. NPO for now,  until contractions cease, in case of need to transition to emergency C-section.  8. Continuous fetal monitoring.  9. Continue to monitor BPs.  10.  If delivery this admission is required, patient desires BTL at time of repeat C-section.   Hildred Laser, MD [facility]

## 2016-03-05 DIAGNOSIS — N938 Other specified abnormal uterine and vaginal bleeding: Secondary | ICD-10-CM | POA: Diagnosis not present

## 2016-03-05 DIAGNOSIS — O4413 Placenta previa with hemorrhage, third trimester: Secondary | ICD-10-CM | POA: Diagnosis not present

## 2016-03-05 LAB — RPR: RPR Ser Ql: NONREACTIVE

## 2016-03-05 LAB — VARICELLA ZOSTER ANTIBODY, IGG: VARICELLA IGG: 518 {index} (ref >165)

## 2016-03-05 NOTE — Discharge Summary (Signed)
Patient discharged home, RN educated on importance of maintaining on strict bedrest and pelvic rest. RN instructed pt on when to call MD or return to hospital, appointments made. Patient sates understanding. Pt left floor via wheelchair in stable condition, denies any other needs at this time

## 2016-03-05 NOTE — Progress Notes (Signed)
  Antenatal Progress Note  Subjective:     Patient ID: Allison Manning is a 30 y.o. female 6761w6d, Estimated Date of Delivery: 04/24/16 who was admitted for vaginal bleeding and preterm contractions with complete previa. Also with notable history for GHTN, prior C-section x 1, and h/o pre-eclampsia in prior 2 pregnancies.  HD# 2.   Subjective:  Patient denies complaints today. Notes minimal vaginal bleeding overnight.   Review of Systems Denies contractions, leakage of fluids, and reports good fetal movement.     Objective:   Filed Vitals:   03/04/16 1526 03/04/16 2017 03/04/16 2350 03/05/16 0319  BP: 123/77 130/72 129/70 105/62  Pulse: 112 117 102 93  Temp: 98.8 F (37.1 C) 98.6 F (37 C) 98.3 F (36.8 C) 98.5 F (36.9 C)  TempSrc: Oral Oral Oral Oral  Resp: 16 18 16 16     General appearance: alert and no distress Lungs: clear to auscultation bilaterally Heart: regular rate and rhythm, S1, S2 normal, no murmur, click, rub or gallop Abdomen: gravid, soft, non-tender; bowel sounds normal; no masses,  no organomegaly Pelvic: scant blood on pad. Internal exam deferred Extremities: extremities normal, atraumatic, no cyanosis or edema   FHT: baseline 140 bpm, accels present, decels present.  Variability: moderate.  Few intermittent variable decelerations noted overnight. Toco: no contractions  Assessment:  30 y.o. female 5161w6d, Estimated Date of Delivery: 04/24/16 with:  1. Vaginal bleeding in pregnancy 2. Complete placenta previa 3. Preterm contractions, resolved 4. H/o prior C-section x 1 5. Gestational HTN  Plan:   1. Patient to receive second dose of antenatal steroids.  2. Can d/c home on bedrest.  Strict bleeding precautions given.  3. GHTN, BPs wnl while inpatient.  4. To f/u in office on Friday for repeat growth scan.  Limited scan performed yesterday notes GA of [redacted] weeks.  Patient with h/o IUGR in prior pregnancy.    Hildred LaserAnika Anshika Pethtel, MD [facility]

## 2016-03-05 NOTE — Discharge Instructions (Signed)
Vaginal Bleeding During Pregnancy, Third Trimester  A small amount of bleeding (spotting) from the vagina is common in pregnancy. Sometimes the bleeding is normal and is not a problem, and sometimes it is a sign of something serious. Be sure to tell your doctor about any bleeding from your vagina right away. HOME CARE  Watch your condition for any changes.  Follow your doctor's instructions about how active you can be.  If you are on bed rest:  You may need to stay in bed and only get up to use the bathroom.  You may be allowed to do some activities.  If you need help, make plans for someone to help you.  Write down:  The number of pads you use each day.  How often you change pads.  How soaked (saturated) your pads are.  Do not use tampons.  Do not douche.  Do not have sex or orgasms until your doctor says it is okay.  Follow your doctor's advice about lifting, driving, and doing physical activities.  If you pass any tissue from your vagina, save the tissue so you can show it to your doctor.  Only take medicines as told by your doctor.  Do not take aspirin because it can make you bleed.  Keep all follow-up visits as told by your doctor. GET HELP IF:   You bleed from your vagina.  You have cramps.  You have labor pains.  You have a fever that does not go away after you take medicine. GET HELP RIGHT AWAY IF:  You have very bad cramps in your back or belly (abdomen).  You have chills.  You have a gush of fluid from your vagina.  You pass large clots or tissue from your vagina.  You bleed more.  You feel light-headed or weak.  You pass out (faint).  You do not feel your baby move around as much as before. MAKE SURE YOU:  Understand these instructions.  Will watch your condition.  Will get help right away if you are not doing well or get worse.   This information is not intended to replace advice given to you by your health care provider. Make sure  you discuss any questions you have with your health care provider.   Document Released: 01/24/2014 Document Reviewed: 01/24/2014 Elsevier Interactive Patient Education 2016 Else[employer] Inc.    Pelvic Rest Pelvic rest is sometimes recommended for women when:   The placenta is partially or completely covering the opening of the cervix (placenta previa).  There is bleeding between the uterine wall and the amniotic sac in the first trimester (subchorionic hemorrhage).  The cervix begins to open without labor starting (incompetent cervix, cervical insufficiency).  The labor is too early (preterm labor). HOME CARE INSTRUCTIONS  Do not have sexual intercourse, stimulation, or an orgasm.  Do not use tampons, douche, or put anything in the vagina.  Do not lift anything over 10 pounds (4.5 kg).  Avoid strenuous activity or straining your pelvic muscles. SEEK MEDICAL CARE IF:  You have any vaginal bleeding during pregnancy. Treat this as a potential emergency.  You have cramping pain felt low in the stomach (stronger than menstrual cramps).  You notice vaginal discharge (watery, mucus, or bloody).  You have a low, dull backache.  There are regular contractions or uterine tightening. SEEK IMMEDIATE MEDICAL CARE IF: You have vaginal bleeding and have placenta previa.    This information is not intended to replace advice given to you by your  health care provider. Make sure you discuss any questions you have with your health care provider.   Document Released: 01/04/2011 Document Revised: 12/02/2011 Document Reviewed: 03/13/2015 Elsevier Interactive Patient Education [employer]  Call your provider for any other concerns

## 2016-03-05 NOTE — Discharge Summary (Signed)
OB Discharge Summary     Patient Name: Allison Manning DOB: April 13, 1986 MRN: [id number]  Date of admission: 03/04/2016 Delivering MD: This patient has no babies on file.  Date of discharge: 03/05/2016  Admitting diagnosis: 32 weeks bleeding Intrauterine pregnancy: 6375w6d     Secondary diagnosis:  Active Problems:   H/O cesarean section   Supervision of high risk pregnancy in second trimester   Placenta previa antepartum in third trimester   Gestational hypertension w/o significant proteinuria in 2nd trimester   History of preterm delivery, currently pregnant in third trimester   Vaginal bleeding in pregnancy      Discharge diagnosis: Vaginal bleeding in 3rd trimester, complete placenta previa, history of prior C-section x 1, preterm contractions, gestational hypertension                                                   Hospital course:  The patient was admitted to Labor and Delivery for observation.  She was treated with IVF and a dose of terbutaline for contractions with cessation of contractions.  Ultrasound revealed persistence of complete placenta previa, no evidence of abruption, SGA infant on ultrasound.  Patient received full course of antenatal steroids.  Patient was discharged on HD#2. Advised on continued bedrest and pelvic rest.    Physical exam  Filed Vitals:   03/04/16 1526 03/04/16 2017 03/04/16 2350 03/05/16 0319  BP: 123/77 130/72 129/70 105/62  Pulse: 112 117 102 93  Temp: 98.8 F (37.1 C) 98.6 F (37 C) 98.3 F (36.8 C) 98.5 F (36.9 C)  TempSrc: Oral Oral Oral Oral  Resp: 16 18 16 16    General: alert and no distress Lochia: appropriate Uterine Fundus: firm Incision: N/A DVT Evaluation: Negative Homan's sign. No cords or calf tenderness. No significant calf/ankle edema.   Labs: Lab Results  Component Value Date   WBC 9.2 03/04/2016   HGB 11.5* 03/04/2016   HCT 34.3* 03/04/2016   MCV 92.7 03/04/2016   PLT 301 03/04/2016   CMP Latest Ref Rng  11/07/2015  Glucose 65 - 99 mg/dL 73  BUN 6 - 20 mg/dL 8  Creatinine 9.560.57 - 2.131.00 mg/dL 0.860.69  Sodium 578134 - 469144 mmol/L 137  Potassium 3.5 - 5.2 mmol/L 4.0  Chloride 96 - 106 mmol/L 100  CO2 18 - 29 mmol/L 23  Calcium 8.7 - 10.2 mg/dL 9.1  Total Protein 6.0 - 8.5 g/dL 6.7  Total Bilirubin 0.0 - 1.2 mg/dL <6.2<0.2  Alkaline Phos 39 - 117 IU/L 53  AST 0 - 40 IU/L 8  ALT 0 - 32 IU/L 7    03/04/2016 OB Ultrasound:  EXAM: LIMITED OBSTETRIC ULTRASOUND  FINDINGS: Number of Fetuses: Single  Heart Rate: 135 bpm  Movement: Present  Presentation: Cephalic  Placental Location: Posterior  Previa: Complete  Amniotic Fluid (Subjective): Within normal limits.  BPD: 7.0cm 28w 0d  MATERNAL FINDINGS:  Cervix: 3.2 cm Appears closed.  Uterus/Adnexae: No abnormality visualized.  IMPRESSION: Single viable intrauterine pregnancy at 28 weeks 0 days. Complete placenta previa. No evidence of abruption.  This exam is performed on an emergent basis and does not comprehensively evaluate fetal size, dating, or anatomy; follow-up complete OB US should be considered if further fetal assessment is warranted.  Discharge instruction: per After Visit Summary   After visit meds:    Medication List  ASK your doctor about these medications        aspirin EC 81 MG tablet  Take 81 mg by mouth daily.     prenatal multivitamin Tabs tablet  Take 1 tablet by mouth daily at 12 noon.     ranitidine 75 MG tablet  Commonly known as:  ZANTAC 75  Take 1 tablet (75 mg total) by mouth 2 (two) times daily.        Diet: routine diet  Activity: Pelvic rest for until delivery, bedrest x 2 weeks.   Outpatient follow up: in 1 day for appointment, 3 days for ultrasound Follow up Appt:Future Appointments Date Time Provider Department Center  03/06/2016 11:15 AM Hildred Laser, MD [facility] None  03/12/2016 4:00 PM [facility] [place] [facility] None   Follow up Visit:No Follow-up on  file.    03/05/2016 Hildred Laser, MD

## 2016-03-06 ENCOUNTER — Encounter: Payer: Medicaid Other | Admitting: Obstetrics and Gynecology

## 2016-03-06 ENCOUNTER — Other Ambulatory Visit: Payer: Self-pay | Admitting: Obstetrics and Gynecology

## 2016-03-06 DIAGNOSIS — O09292 Supervision of pregnancy with other poor reproductive or obstetric history, second trimester: Secondary | ICD-10-CM

## 2016-03-06 DIAGNOSIS — O4402 Placenta previa specified as without hemorrhage, second trimester: Secondary | ICD-10-CM

## 2016-03-06 LAB — CULTURE, BETA STREP (GROUP B ONLY)

## 2016-03-08 ENCOUNTER — Ambulatory Visit (INDEPENDENT_AMBULATORY_CARE_PROVIDER_SITE_OTHER): Payer: Medicaid Other

## 2016-03-08 DIAGNOSIS — O09292 Supervision of pregnancy with other poor reproductive or obstetric history, second trimester: Secondary | ICD-10-CM

## 2016-03-08 DIAGNOSIS — O4412 Placenta previa with hemorrhage, second trimester: Secondary | ICD-10-CM | POA: Diagnosis not present

## 2016-03-08 DIAGNOSIS — O4402 Placenta previa specified as without hemorrhage, second trimester: Secondary | ICD-10-CM

## 2016-03-12 ENCOUNTER — Ambulatory Visit: Payer: Medicaid Other

## 2016-03-13 ENCOUNTER — Ambulatory Visit (INDEPENDENT_AMBULATORY_CARE_PROVIDER_SITE_OTHER): Payer: Medicaid Other | Admitting: Obstetrics and Gynecology

## 2016-03-13 VITALS — BP 135/82 | HR 101 | Wt 158.9 lb

## 2016-03-13 DIAGNOSIS — O4693 Antepartum hemorrhage, unspecified, third trimester: Secondary | ICD-10-CM

## 2016-03-13 DIAGNOSIS — O4413 Placenta previa with hemorrhage, third trimester: Secondary | ICD-10-CM

## 2016-03-13 DIAGNOSIS — O0992 Supervision of high risk pregnancy, unspecified, second trimester: Secondary | ICD-10-CM

## 2016-03-13 DIAGNOSIS — O4403 Placenta previa specified as without hemorrhage, third trimester: Secondary | ICD-10-CM

## 2016-03-13 DIAGNOSIS — O0993 Supervision of high risk pregnancy, unspecified, third trimester: Secondary | ICD-10-CM

## 2016-03-13 DIAGNOSIS — O133 Gestational [pregnancy-induced] hypertension without significant proteinuria, third trimester: Secondary | ICD-10-CM

## 2016-03-13 DIAGNOSIS — Z98891 History of uterine scar from previous surgery: Secondary | ICD-10-CM

## 2016-03-13 LAB — POCT URINALYSIS DIPSTICK
BILIRUBIN UA: NEGATIVE
Glucose, UA: NEGATIVE
KETONES UA: NEGATIVE
Nitrite, UA: NEGATIVE
PROTEIN UA: NEGATIVE
SPEC GRAV UA: 1.015
Urobilinogen, UA: NEGATIVE
pH, UA: 6.5

## 2016-03-13 MED ORDER — PANTOPRAZOLE SODIUM 20 MG PO TBEC: 20.0000 mg | DELAYED_RELEASE_TABLET | Freq: Two times a day (BID) | ORAL | Status: DC

## 2016-03-17 NOTE — Progress Notes (Signed)
ROB: Patient notes continued spotting, but no further heavy bleeding.  Is supposed to be doing twice weekly NSTs, however has transportation issues. Is s/p growth scan last week, growth at 19%ile. Will plan for delivery between 36-37 weeks for complete previa (scheduled for 03/29/16). Continue pelvic rest, limited activity. RTC in 1 week.

## 2016-03-20 ENCOUNTER — Ambulatory Visit (INDEPENDENT_AMBULATORY_CARE_PROVIDER_SITE_OTHER): Payer: Medicaid Other | Admitting: Obstetrics and Gynecology

## 2016-03-20 VITALS — BP 143/93 | HR 90 | Wt 161.8 lb

## 2016-03-20 DIAGNOSIS — Z113 Encounter for screening for infections with a predominantly sexual mode of transmission: Secondary | ICD-10-CM

## 2016-03-20 DIAGNOSIS — O26843 Uterine size-date discrepancy, third trimester: Secondary | ICD-10-CM

## 2016-03-20 DIAGNOSIS — O4413 Placenta previa with hemorrhage, third trimester: Secondary | ICD-10-CM

## 2016-03-20 DIAGNOSIS — O09893 Supervision of other high risk pregnancies, third trimester: Secondary | ICD-10-CM

## 2016-03-20 DIAGNOSIS — O4403 Placenta previa specified as without hemorrhage, third trimester: Secondary | ICD-10-CM

## 2016-03-20 DIAGNOSIS — Z98891 History of uterine scar from previous surgery: Secondary | ICD-10-CM

## 2016-03-20 DIAGNOSIS — O0993 Supervision of high risk pregnancy, unspecified, third trimester: Secondary | ICD-10-CM

## 2016-03-20 DIAGNOSIS — O132 Gestational [pregnancy-induced] hypertension without significant proteinuria, second trimester: Secondary | ICD-10-CM

## 2016-03-20 DIAGNOSIS — Z3685 Encounter for antenatal screening for Streptococcus B: Secondary | ICD-10-CM

## 2016-03-20 DIAGNOSIS — O09213 Supervision of pregnancy with history of pre-term labor, third trimester: Secondary | ICD-10-CM

## 2016-03-20 DIAGNOSIS — Z36 Encounter for antenatal screening of mother: Secondary | ICD-10-CM

## 2016-03-20 DIAGNOSIS — O4693 Antepartum hemorrhage, unspecified, third trimester: Secondary | ICD-10-CM

## 2016-03-20 LAB — POCT URINALYSIS DIPSTICK
BILIRUBIN UA: NEGATIVE
GLUCOSE UA: NEGATIVE
Ketones, UA: NEGATIVE
NITRITE UA: NEGATIVE
PH UA: 6.5
Protein, UA: NEGATIVE
Spec Grav, UA: 1.025
UROBILINOGEN UA: NEGATIVE

## 2016-03-20 NOTE — Progress Notes (Signed)
ROB: Patient continues to complain of vaginal spotting/light bleeding, mostly at night. Still notes good FM. For repeat scan for growth (continues to trend down, most recent was 19%ile 2 weeks ago) next week.  Continue pelvic rest. Discussed fetal kick counts, bleeding precautions. For scheduled repeat C-section and BTL on 03/28/16 for complete previa and gestational HTN (currently on no meds). Advised to discontinue aspirin.

## 2016-03-23 LAB — GC/CHLAMYDIA PROBE AMP
Chlamydia trachomatis, NAA: NEGATIVE
Neisseria gonorrhoeae by PCR: NEGATIVE

## 2016-03-25 LAB — CULTURE, BETA STREP (GROUP B ONLY): STREP GP B CULTURE: NEGATIVE

## 2016-03-27 ENCOUNTER — Other Ambulatory Visit: Payer: Self-pay | Admitting: Obstetrics and Gynecology

## 2016-03-27 ENCOUNTER — Telehealth: Payer: Self-pay

## 2016-03-27 ENCOUNTER — Ambulatory Visit (INDEPENDENT_AMBULATORY_CARE_PROVIDER_SITE_OTHER): Payer: Medicaid Other

## 2016-03-27 ENCOUNTER — Encounter
Admission: RE | Admit: 2016-03-27 | Discharge: 2016-03-27 | Disposition: A | Discharge status: Home / Self Care | Payer: Medicaid Other | Source: Ambulatory Visit | Attending: Obstetrics and Gynecology | Admitting: Obstetrics and Gynecology

## 2016-03-27 ENCOUNTER — Ambulatory Visit: Payer: Medicaid Other

## 2016-03-27 DIAGNOSIS — O26843 Uterine size-date discrepancy, third trimester: Secondary | ICD-10-CM | POA: Diagnosis not present

## 2016-03-27 LAB — CBC
HEMATOCRIT: 32.5 % — AB (ref 35.0–47.0)
HEMOGLOBIN: 11.2 g/dL — AB (ref 12.0–16.0)
MCH: 31.9 pg (ref 26.0–34.0)
MCHC: 34.5 g/dL (ref 32.0–36.0)
MCV: 92.3 fL (ref 80.0–100.0)
Platelets: 291 10*3/uL (ref 150–440)
RBC: 3.52 MIL/uL — ABNORMAL LOW (ref 3.80–5.20)
RDW: 13.2 % (ref 11.5–14.5)
WBC: 8.8 10*3/uL (ref 3.6–11.0)

## 2016-03-27 LAB — RAPID HIV SCREEN (HIV 1/2 AB+AG)
HIV 1/2 ANTIBODIES: NONREACTIVE
HIV-1 P24 Antigen - HIV24: NONREACTIVE

## 2016-03-27 LAB — TYPE AND SCREEN
ABO/RH(D): O POS
ANTIBODY SCREEN: NEGATIVE
EXTEND SAMPLE REASON: UNDETERMINED

## 2016-03-27 NOTE — Patient Instructions (Signed)
  Your procedure is scheduled on:March 28, 2016 (Thursday) Report to EMERGENCY DEPARTMENT To find out your arrival time please call [phone] between 1PM - 3PM on ARRIVAL TIME 5:30 AM.  Remember: Instructions that are not followed completely may result in serious medical risk, up to and including death, or upon the discretion of your surgeon and anesthesiologist your surgery may need to be rescheduled.    _x___ 1. Do not eat food or drink liquids after midnight. No gum chewing or hard candies.     _x___ 2. No Alcohol for 24 hours before or after surgery.   _x___ 3. Do Not Smoke For 24 Hours Prior to Your Surgery.   ____ 4. Bring all medications with you on the day of surgery if instructed.    _x___ 5. Notify your doctor if there is any change in your medical condition     (cold, fever, infections).       Do not wear jewelry, make-up, hairpins, clips or nail polish.  Do not wear lotions, powders, or perfumes. You may wear deodorant.  Do not shave 48 hours prior to surgery. Men may shave face and neck.  Do not bring valuables to the hospital.    [facility] belongings or valuables.               Contacts, dentures or bridgework may not be worn into surgery.  Leave your suitcase in the car. After surgery it may be brought to your room.  For patients admitted to the hospital, discharge time is determined by your                treatment team.   Patients discharged the day of surgery will not be allowed to drive home.   Please read over the following fact sheets that you were given:   Surgical Site Infection Prevention   ____ Take these medicines the morning of surgery with A SIP OF WATER:    1.   2.   3.   4.  5.  6.  ____ Fleet Enema (as directed)   _x___ Use CHG Soap as directed (SAGE WIPES)  ____ Use inhalers on the day of surgery  ____ Stop metformin 2 days prior to surgery    ____ Take 1/2 of usual insulin dose the night before surgery  and none on the morning of surgery.   _x___ Stop Coumadin/Plavix/aspirin on (PATIENT STOPPED ASPIRIN ON JULY 5)   _x___ Stop Anti-inflammatories on (NO NSAIDS)   ____ Stop supplements until after surgery.    ____ Bring C-Pap to the hospital.

## 2016-03-27 NOTE — Pre-Procedure Instructions (Signed)
Labor and Delivery nurse, Zerita BoersJennifer Miller notified of patient complaint of contractions 20 minutes apart and regular.Informed she should keep appointment with Dr. Valentino Saxonherry at 3:00 pm today and" have her to drink plenty of  water for hydration."

## 2016-03-27 NOTE — Telephone Encounter (Signed)
Pt aware. She stopped aspirin yesterday.

## 2016-03-27 NOTE — Telephone Encounter (Signed)
-----   Message from Hildred LaserAnika Cherry, MD sent at 03/22/2016 10:26 PM EDT ----- Regarding: medication Please advise Ms. Burandt to discontinue aspirin in preparation for her upcoming C-section on 03/28/16.   Thanks,   Dr. Valentino Saxonherry

## 2016-03-28 ENCOUNTER — Inpatient Hospital Stay: Payer: Medicaid Other | Admitting: Certified Registered Nurse Anesthetist

## 2016-03-28 ENCOUNTER — Encounter
Admission: RE | Disposition: A | Discharge status: Home / Self Care | Payer: Self-pay | Source: Ambulatory Visit | Attending: Obstetrics and Gynecology

## 2016-03-28 ENCOUNTER — Inpatient Hospital Stay
Admission: RE | Admit: 2016-03-28 | Discharge: 2016-03-30 | DRG: 765 | Disposition: A | Discharge status: Home / Self Care | Payer: Medicaid Other | Source: Ambulatory Visit | Attending: Obstetrics and Gynecology | Admitting: Obstetrics and Gynecology

## 2016-03-28 DIAGNOSIS — O4403 Placenta previa specified as without hemorrhage, third trimester: Secondary | ICD-10-CM | POA: Diagnosis present

## 2016-03-28 DIAGNOSIS — O365990 Maternal care for other known or suspected poor fetal growth, unspecified trimester, not applicable or unspecified: Secondary | ICD-10-CM

## 2016-03-28 DIAGNOSIS — Z885 Allergy status to narcotic agent status: Secondary | ICD-10-CM

## 2016-03-28 DIAGNOSIS — Z302 Encounter for sterilization: Secondary | ICD-10-CM | POA: Diagnosis not present

## 2016-03-28 DIAGNOSIS — Z3493 Encounter for supervision of normal pregnancy, unspecified, third trimester: Secondary | ICD-10-CM | POA: Diagnosis not present

## 2016-03-28 DIAGNOSIS — O09299 Supervision of pregnancy with other poor reproductive or obstetric history, unspecified trimester: Secondary | ICD-10-CM

## 2016-03-28 DIAGNOSIS — Z98891 History of uterine scar from previous surgery: Secondary | ICD-10-CM

## 2016-03-28 DIAGNOSIS — O0992 Supervision of high risk pregnancy, unspecified, second trimester: Secondary | ICD-10-CM

## 2016-03-28 DIAGNOSIS — O9081 Anemia of the puerperium: Secondary | ICD-10-CM | POA: Diagnosis not present

## 2016-03-28 DIAGNOSIS — O09893 Supervision of other high risk pregnancies, third trimester: Secondary | ICD-10-CM

## 2016-03-28 DIAGNOSIS — O09213 Supervision of pregnancy with history of pre-term labor, third trimester: Secondary | ICD-10-CM

## 2016-03-28 DIAGNOSIS — O134 Gestational [pregnancy-induced] hypertension without significant proteinuria, complicating childbirth: Secondary | ICD-10-CM | POA: Diagnosis present

## 2016-03-28 DIAGNOSIS — O34211 Maternal care for low transverse scar from previous cesarean delivery: Principal | ICD-10-CM | POA: Diagnosis present

## 2016-03-28 DIAGNOSIS — O365930 Maternal care for other known or suspected poor fetal growth, third trimester, not applicable or unspecified: Secondary | ICD-10-CM | POA: Diagnosis present

## 2016-03-28 DIAGNOSIS — O133 Gestational [pregnancy-induced] hypertension without significant proteinuria, third trimester: Secondary | ICD-10-CM | POA: Diagnosis present

## 2016-03-28 DIAGNOSIS — D62 Acute posthemorrhagic anemia: Secondary | ICD-10-CM | POA: Diagnosis not present

## 2016-03-28 DIAGNOSIS — Z3A36 36 weeks gestation of pregnancy: Secondary | ICD-10-CM | POA: Diagnosis not present

## 2016-03-28 LAB — RPR: RPR: NONREACTIVE

## 2016-03-28 SURGERY — Surgical Case
Anesthesia: Spinal | Laterality: Bilateral | Wound class: Clean Contaminated

## 2016-03-28 MED ORDER — ONDANSETRON HCL 4 MG/2ML IJ SOLN
INTRAMUSCULAR | Status: DC | PRN
  Administered 2016-03-28: 4 mg via INTRAVENOUS

## 2016-03-28 MED ORDER — TRIAMCINOLONE ACETONIDE 40 MG/ML IJ SUSP
Freq: Once | INTRAMUSCULAR | Status: DC
  Filled 2016-03-28: qty 1

## 2016-03-28 MED ORDER — HYDROMORPHONE HCL 2 MG PO TABS: 4.0000 mg | ORAL_TABLET | ORAL | Status: DC | PRN

## 2016-03-28 MED ORDER — TRIAMCINOLONE ACETONIDE 40 MG/ML IJ SUSP
INTRAMUSCULAR | Status: DC | PRN
  Administered 2016-03-28: 40 mg via INTRADERMAL

## 2016-03-28 MED ORDER — ONDANSETRON HCL 4 MG/2ML IJ SOLN: 4.0000 mg | Freq: Three times a day (TID) | INTRAMUSCULAR | Status: DC | PRN

## 2016-03-28 MED ORDER — OXYTOCIN 40 UNITS IN LACTATED RINGERS INFUSION - SIMPLE MED
INTRAVENOUS | Status: AC
  Filled 2016-03-28: qty 1000

## 2016-03-28 MED ORDER — NALBUPHINE HCL 10 MG/ML IJ SOLN: 5.0000 mg | Freq: Once | INTRAMUSCULAR | Status: DC | PRN

## 2016-03-28 MED ORDER — MIDAZOLAM HCL 2 MG/2ML IJ SOLN
INTRAMUSCULAR | Status: DC | PRN
  Administered 2016-03-28: 1 mg via INTRAVENOUS

## 2016-03-28 MED ORDER — SIMETHICONE 80 MG PO CHEW
80.0000 mg | CHEWABLE_TABLET | ORAL | Status: DC
  Filled 2016-03-28 (×2): qty 1

## 2016-03-28 MED ORDER — LACTATED RINGERS IV SOLN: INTRAVENOUS | Status: DC

## 2016-03-28 MED ORDER — WITCH HAZEL-GLYCERIN EX PADS: 1.0000 "application " | MEDICATED_PAD | CUTANEOUS | Status: DC | PRN

## 2016-03-28 MED ORDER — FENTANYL CITRATE (PF) 100 MCG/2ML IJ SOLN
INTRAMUSCULAR | Status: DC | PRN
  Administered 2016-03-28 (×4): 50 ug via INTRAVENOUS

## 2016-03-28 MED ORDER — LIDOCAINE 5 % EX PTCH
1.0000 | MEDICATED_PATCH | CUTANEOUS | Status: DC
  Administered 2016-03-29 – 2016-03-30 (×2): 1 via TRANSDERMAL
  Filled 2016-03-28 (×2): qty 1

## 2016-03-28 MED ORDER — SIMETHICONE 80 MG PO CHEW: 80.0000 mg | CHEWABLE_TABLET | ORAL | Status: DC | PRN

## 2016-03-28 MED ORDER — NALBUPHINE HCL 10 MG/ML IJ SOLN: 5.0000 mg | INTRAMUSCULAR | Status: DC | PRN

## 2016-03-28 MED ORDER — MENTHOL 3 MG MT LOZG
1.0000 | LOZENGE | OROMUCOSAL | Status: DC | PRN
  Filled 2016-03-28: qty 9

## 2016-03-28 MED ORDER — BUPIVACAINE IN DEXTROSE 0.75-8.25 % IT SOLN
INTRATHECAL | Status: DC | PRN
  Administered 2016-03-28: 1.6 mL via INTRATHECAL

## 2016-03-28 MED ORDER — MEASLES, MUMPS & RUBELLA VAC ~~LOC~~ INJ
0.5000 mL | INJECTION | Freq: Once | SUBCUTANEOUS | Status: DC
  Filled 2016-03-28: qty 0.5

## 2016-03-28 MED ORDER — OXYTOCIN 40 UNITS IN LACTATED RINGERS INFUSION - SIMPLE MED
INTRAVENOUS | Status: DC | PRN
  Administered 2016-03-28: 699 mL via INTRAVENOUS
  Administered 2016-03-28: 1 mL via INTRAVENOUS

## 2016-03-28 MED ORDER — LACTATED RINGERS IV SOLN
INTRAVENOUS | Status: DC
  Administered 2016-03-28 – 2016-03-29 (×3): via INTRAVENOUS

## 2016-03-28 MED ORDER — SOD CITRATE-CITRIC ACID 500-334 MG/5ML PO SOLN
30.0000 mL | Freq: Once | ORAL | Status: AC
  Administered 2016-03-28: 30 mL via ORAL

## 2016-03-28 MED ORDER — KETOROLAC TROMETHAMINE 30 MG/ML IJ SOLN
30.0000 mg | Freq: Four times a day (QID) | INTRAMUSCULAR | Status: DC | PRN
  Administered 2016-03-28: 30 mg via INTRAVENOUS
  Filled 2016-03-28 (×3): qty 1

## 2016-03-28 MED ORDER — KETOROLAC TROMETHAMINE 30 MG/ML IJ SOLN
30.0000 mg | Freq: Four times a day (QID) | INTRAMUSCULAR | Status: DC | PRN
  Filled 2016-03-28: qty 1

## 2016-03-28 MED ORDER — ZOLPIDEM TARTRATE 5 MG PO TABS: 5.0000 mg | ORAL_TABLET | Freq: Every evening | ORAL | Status: DC | PRN

## 2016-03-28 MED ORDER — OXYTOCIN 40 UNITS IN LACTATED RINGERS INFUSION - SIMPLE MED
2.5000 [IU]/h | INTRAVENOUS | Status: AC
  Filled 2016-03-28: qty 1000

## 2016-03-28 MED ORDER — MEPERIDINE HCL 25 MG/ML IJ SOLN: 6.2500 mg | INTRAMUSCULAR | Status: DC | PRN

## 2016-03-28 MED ORDER — MAGNESIUM HYDROXIDE 400 MG/5ML PO SUSP: 30.0000 mL | ORAL | Status: DC | PRN

## 2016-03-28 MED ORDER — CEFAZOLIN SODIUM-DEXTROSE 2-4 GM/100ML-% IV SOLN
INTRAVENOUS | Status: AC
  Administered 2016-03-28: 2 g via INTRAVENOUS
  Filled 2016-03-28: qty 100

## 2016-03-28 MED ORDER — CEFAZOLIN SODIUM-DEXTROSE 2-4 GM/100ML-% IV SOLN
2.0000 g | INTRAVENOUS | Status: AC
  Administered 2016-03-28: 2 g via INTRAVENOUS

## 2016-03-28 MED ORDER — LACTATED RINGERS IV SOLN
INTRAVENOUS | Status: DC
  Administered 2016-03-28: 07:00:00 via INTRAVENOUS

## 2016-03-28 MED ORDER — COCONUT OIL OIL: 1.0000 "application " | TOPICAL_OIL | Status: DC | PRN

## 2016-03-28 MED ORDER — FENTANYL CITRATE (PF) 100 MCG/2ML IJ SOLN: 25.0000 ug | INTRAMUSCULAR | Status: DC | PRN

## 2016-03-28 MED ORDER — SENNOSIDES-DOCUSATE SODIUM 8.6-50 MG PO TABS
2.0000 | ORAL_TABLET | ORAL | Status: DC
  Administered 2016-03-28 – 2016-03-30 (×2): 2 via ORAL
  Filled 2016-03-28 (×2): qty 2

## 2016-03-28 MED ORDER — IBUPROFEN 600 MG PO TABS
600.0000 mg | ORAL_TABLET | Freq: Four times a day (QID) | ORAL | Status: DC
  Administered 2016-03-28 – 2016-03-30 (×5): 600 mg via ORAL
  Filled 2016-03-28 (×6): qty 1

## 2016-03-28 MED ORDER — LIDOCAINE 5 % EX PTCH
1.0000 | MEDICATED_PATCH | CUTANEOUS | Status: DC
  Filled 2016-03-28: qty 1

## 2016-03-28 MED ORDER — PRENATAL MULTIVITAMIN CH
1.0000 | ORAL_TABLET | Freq: Every day | ORAL | Status: DC
  Administered 2016-03-28 – 2016-03-30 (×3): 1 via ORAL
  Filled 2016-03-28 (×3): qty 1

## 2016-03-28 MED ORDER — ACETAMINOPHEN 325 MG PO TABS: 650.0000 mg | ORAL_TABLET | ORAL | Status: DC | PRN

## 2016-03-28 MED ORDER — IBUPROFEN 600 MG PO TABS: 600.0000 mg | ORAL_TABLET | Freq: Four times a day (QID) | ORAL | Status: DC

## 2016-03-28 MED ORDER — PHENYLEPHRINE HCL 10 MG/ML IJ SOLN
INTRAMUSCULAR | Status: DC | PRN
  Administered 2016-03-28 (×3): 100 ug via INTRAVENOUS

## 2016-03-28 MED ORDER — DIBUCAINE 1 % RE OINT: 1.0000 "application " | TOPICAL_OINTMENT | RECTAL | Status: DC | PRN

## 2016-03-28 MED ORDER — MORPHINE SULFATE (PF) 0.5 MG/ML IJ SOLN
INTRAMUSCULAR | Status: DC | PRN
  Administered 2016-03-28: .2 mg via EPIDURAL

## 2016-03-28 MED ORDER — SCOPOLAMINE 1 MG/3DAYS TD PT72: 1.0000 | MEDICATED_PATCH | Freq: Once | TRANSDERMAL | Status: DC

## 2016-03-28 MED ORDER — ONDANSETRON HCL 4 MG/2ML IJ SOLN: 4.0000 mg | Freq: Once | INTRAMUSCULAR | Status: DC | PRN

## 2016-03-28 MED ORDER — NALOXONE HCL 0.4 MG/ML IJ SOLN
0.4000 mg | INTRAMUSCULAR | Status: DC | PRN
  Filled 2016-03-28: qty 1

## 2016-03-28 MED ORDER — SODIUM CHLORIDE 0.9% FLUSH: 3.0000 mL | INTRAVENOUS | Status: DC | PRN

## 2016-03-28 MED ORDER — SOD CITRATE-CITRIC ACID 500-334 MG/5ML PO SOLN
ORAL | Status: AC
  Administered 2016-03-28: 30 mL via ORAL
  Filled 2016-03-28: qty 15

## 2016-03-28 MED ORDER — NALOXONE HCL 2 MG/2ML IJ SOSY
1.0000 ug/kg/h | PREFILLED_SYRINGE | INTRAVENOUS | Status: DC | PRN
  Filled 2016-03-28: qty 2

## 2016-03-28 MED ORDER — LACTATED RINGERS IV SOLN
Freq: Once | INTRAVENOUS | Status: AC
  Administered 2016-03-28: 06:00:00 via INTRAVENOUS

## 2016-03-28 MED ORDER — HYDROMORPHONE HCL 2 MG PO TABS
2.0000 mg | ORAL_TABLET | ORAL | Status: DC | PRN
  Administered 2016-03-29 – 2016-03-30 (×4): 2 mg via ORAL
  Filled 2016-03-28 (×4): qty 1

## 2016-03-28 MED ORDER — TETANUS-DIPHTH-ACELL PERTUSSIS 5-2.5-18.5 LF-MCG/0.5 IM SUSP: 0.5000 mL | Freq: Once | INTRAMUSCULAR | Status: DC

## 2016-03-28 MED ORDER — LIDOCAINE 5 % EX PTCH
MEDICATED_PATCH | CUTANEOUS | Status: DC | PRN
  Administered 2016-03-28: 1 via TRANSDERMAL

## 2016-03-28 MED ORDER — FERROUS SULFATE 325 (65 FE) MG PO TABS
325.0000 mg | ORAL_TABLET | Freq: Two times a day (BID) | ORAL | Status: DC
  Administered 2016-03-28 – 2016-03-30 (×3): 325 mg via ORAL
  Filled 2016-03-28 (×4): qty 1

## 2016-03-28 MED ORDER — DIPHENHYDRAMINE HCL 25 MG PO CAPS: 25.0000 mg | ORAL_CAPSULE | Freq: Four times a day (QID) | ORAL | Status: DC | PRN

## 2016-03-28 SURGICAL SUPPLY — 26 items
BAG COUNTER SPONGE EZ (MISCELLANEOUS) ×2 IMPLANT
CANISTER SUCT 3000ML (MISCELLANEOUS) ×3 IMPLANT
CHLORAPREP W/TINT 26ML (MISCELLANEOUS) ×9 IMPLANT
CLOSURE WOUND 1/2 X4 (GAUZE/BANDAGES/DRESSINGS) ×1
COUNTER SPONGE BAG EZ (MISCELLANEOUS) ×1
DRSG TELFA 3X8 NADH (GAUZE/BANDAGES/DRESSINGS) ×3 IMPLANT
ELECT REM PT RETURN 9FT ADLT (ELECTROSURGICAL) ×3
ELECTRODE REM PT RTRN 9FT ADLT (ELECTROSURGICAL) ×1 IMPLANT
GAUZE SPONGE 4X4 12PLY STRL (GAUZE/BANDAGES/DRESSINGS) ×3 IMPLANT
GLOVE BIO SURGEON STRL SZ 6 (GLOVE) ×3 IMPLANT
GLOVE BIOGEL PI IND STRL 6.5 (GLOVE) ×1 IMPLANT
GLOVE BIOGEL PI INDICATOR 6.5 (GLOVE) ×2
GOWN STRL REUS W/ TWL LRG LVL3 (GOWN DISPOSABLE) ×2 IMPLANT
GOWN STRL REUS W/TWL LRG LVL3 (GOWN DISPOSABLE) ×4
KIT RM TURNOVER STRD PROC AR (KITS) ×3 IMPLANT
NS IRRIG 1000ML POUR BTL (IV SOLUTION) ×3 IMPLANT
PACK C SECTION AR (MISCELLANEOUS) ×3 IMPLANT
PAD OB MATERNITY 4.3X12.25 (PERSONAL CARE ITEMS) ×3 IMPLANT
PAD PREP 24X41 OB/GYN DISP (PERSONAL CARE ITEMS) ×3 IMPLANT
STRIP CLOSURE SKIN 1/2X4 (GAUZE/BANDAGES/DRESSINGS) ×2 IMPLANT
SUT MNCRL AB 4-0 PS2 18 (SUTURE) ×3 IMPLANT
SUT PLAIN 2 0 XLH (SUTURE) IMPLANT
SUT VIC AB 0 CT1 36 (SUTURE) ×12 IMPLANT
SUT VIC AB 3-0 SH 27 (SUTURE) ×2
SUT VIC AB 3-0 SH 27X BRD (SUTURE) ×1 IMPLANT
SWABSTK COMLB BENZOIN TINCTURE (MISCELLANEOUS) ×3 IMPLANT

## 2016-03-28 NOTE — H&P (Addendum)
Obstetric Preoperative History and Physical  Allison Manning is a 30 y.o. [id number] with IUP at 6560w1d presenting for presenting for scheduled repeat cesarean section for complete placenta previa, h/o prior C-section x 1.  No acute concerns.   Prenatal Course Source of Care: [facility] with onset of care at 20 weeks (transfer from [place]) Pregnancy complications or risks: Patient Active Problem List   Diagnosis Date Noted  . IUGR (intrauterine growth restriction) affecting care of mother 03/28/2016  . S/P cesarean section 03/28/2016  . History of preterm delivery, currently pregnant in third trimester 03/04/2016  . Vaginal bleeding in pregnancy 03/04/2016  . Need for Tdap vaccination 02/20/2016  . Gestational hypertension w/o significant proteinuria in 2nd trimester 01/17/2016  . Placenta previa antepartum in third trimester 12/05/2015  . History of postpartum hemorrhage, currently pregnant 11/07/2015  . H/O cesarean section 11/07/2015  . H/O pre-eclampsia in prior pregnancy, currently pregnant 11/07/2015  . Supervision of high risk pregnancy in second trimester 11/07/2015   She plans to breastfeed She desires bilateral tubal ligation for postpartum contraception.   Prenatal labs and studies: ABO, Rh: --/--/O POS (07/05 1445) Antibody: NEG (07/05 1445) Rubella: 4.72 (01/17 1051) RPR: Non Reactive (07/05 1445)  HBsAg: Negative (01/17 1051)  HIV: Non Reactive (01/17 1051)  GBS: Negative 1 hr Glucola  normal Genetic screening normal Anatomy US normal   Past Medical History  Diagnosis Date  . History of pre-eclampsia     Past Surgical History  Procedure Laterality Date  . Cesarean section      low lying placentia,     OB History  Gravida Para Term Preterm AB SAB TAB Ectopic Multiple Living  4 3 2 1      3     # Outcome Date GA Lbr Len/2nd Weight Sex Delivery Anes PTL Lv  4 Current           3 Preterm 2015 6147w0d  4 lb 4 oz (1.928 kg) M CS-Unspec Spinal  Y    Complications: Other Excessive Bleeding,Pre-eclampsia,Low lying placenta nos or without hemorrhage, third trimester  2 Term 2013 6064w0d  3 lb 4 oz (1.474 kg) M Vag-Spont EPI  Y     Complications: Pre-eclampsia  1 Term 2009 5959w0d  6 lb 9 oz (2.977 kg) M Vag-Spont EPI  Y    Obstetric Comments  H/o postpartum hemorrhage in G3 pregnancy (and possible abruption?). Had low-lying placenta.     Social History   Social History  . Marital Status: Married    Spouse Name: N/A  . Number of Children: N/A  . Years of Education: N/A   Social History Main Topics  . Smoking status: Never Smoker   . Smokeless tobacco: Never Used  . Alcohol Use: No  . Drug Use: No  . Sexual Activity: Yes    Birth Control/ Protection: None   Other Topics Concern  . None   Social History Narrative    History reviewed. No pertinent family history.  Prescriptions prior to admission  Medication Sig Dispense Refill Last Dose  . Prenatal Vit-Fe Fumarate-FA (PRENATAL MULTIVITAMIN) TABS tablet Take 1 tablet by mouth daily at 12 noon.   03/27/2016 at Unknown time  . aspirin EC 81 MG tablet Take 81 mg by mouth daily. Reported on 03/28/2016   Not Taking at Unknown time  . pantoprazole (PROTONIX) 20 MG tablet Take 1 tablet (20 mg total) by mouth 2 (two) times daily. (Patient not taking: Reported on 03/27/2016) 60 tablet 2 Not  Taking at Unknown time    Allergies  Allergen Reactions  . Percocet [Oxycodone-Acetaminophen] Swelling    Review of Systems: Negative except for what is mentioned in HPI.  Physical Exam: BP 129/90 mmHg  Pulse 96  Temp(Src) 98.2 F (36.8 C) (Oral)  Resp 18  Ht 5\' 5"  (1.651 m)  Wt 161 lb (73.029 kg)  BMI 26.79 kg/m2  LMP 06/27/2015 (LMP Unknown) FHR by Doppler: 130 bpm GENERAL: Well-developed, well-nourished female in no acute distress.  LUNGS: Clear to auscultation bilaterally.  HEART: Regular rate and rhythm. ABDOMEN: Soft, nontender, nondistended, gravid, well-healed Pfannenstiel incision  with keloid scar. PELVIC: Deferred EXTREMITIES: Nontender, no edema, 2+ distal pulses.   Pertinent Labs/Studies:   Results for orders placed or performed during the hospital encounter of 03/27/16 (from the past 72 hour(s))  CBC     Status: Abnormal   Collection Time: 03/27/16  2:45 PM  Result Value Ref Range   WBC 8.8 3.6 - 11.0 K/uL   RBC 3.52 (L) 3.80 - 5.20 MIL/uL   Hemoglobin 11.2 (L) 12.0 - 16.0 g/dL   HCT 09.832.5 (L) 11.935.0 - 14.747.0 %   MCV 92.3 80.0 - 100.0 fL   MCH 31.9 26.0 - 34.0 pg   MCHC 34.5 32.0 - 36.0 g/dL   RDW 82.913.2 56.211.5 - 13.014.5 %   Platelets 291 150 - 440 K/uL  Rapid HIV screen (HIV 1/2 Ab+Ag)     Status: None   Collection Time: 03/27/16  2:45 PM  Result Value Ref Range   HIV-1 P24 Antigen - HIV24 NON REACTIVE NON REACTIVE   HIV 1/2 Antibodies NON REACTIVE NON REACTIVE   Interpretation (HIV Ag Ab)      A non reactive test result means that HIV 1 or HIV 2 antibodies and HIV 1 p24 antigen were not detected in the specimen.  RPR     Status: None   Collection Time: 03/27/16  2:45 PM  Result Value Ref Range   RPR Ser Ql Non Reactive Non Reactive    Comment: (NOTE) Performed At: [facility] [address] [place], [place] [id number] Mila HomerHancock William F MD EX:[id number]:[phone]   Type and screen [facility]     Status: None   Collection Time: 03/27/16  2:45 PM  Result Value Ref Range   ABO/RH(D) O POS    Antibody Screen NEG    Sample Expiration 03/30/2016    Extend sample reason PREGNANT WITHIN 3 MONTHS, UNABLE TO EXTEND     Assessment and Plan :Allison Manning is a 30 y.o. [id number] at 8476w1d being admitted  for scheduled cesarean section delivery . The patient is understanding of the planned procedure and is aware of and accepting of all surgical risks, including but not limited to: bleeding which may require transfusion or reoperation; infection which may require antibiotics; injury to bowel, bladder, ureters or other surrounding organs which may require  repair; injury to the fetus; need for additional procedures including hysterectomy in the event of life-threatening complications; placental abnormalities wth subsequent pregnancies; incisional problems; blood clot disorders which may require blood thinners;, and other postoperative/anesthesia complications. The patient is in agreement with the proposed plan, and gives informed written consent for the procedure. All questions have been answered.  Hildred LaserAnika Laiana Fratus, MD [facility]

## 2016-03-28 NOTE — Anesthesia Preprocedure Evaluation (Signed)
Anesthesia Evaluation  Patient identified by MRN, date of birth, ID band Patient awake    Reviewed: Allergy & Precautions, NPO status , Patient's Chart, lab work & pertinent test results, reviewed documented beta blocker date and time   Airway Mallampati: II  TM Distance: >3 FB     Dental  (+) Chipped   Pulmonary          Cardiovascular hypertension,     Neuro/Psych    GI/Hepatic   Endo/Other    Renal/GU      Musculoskeletal   Abdominal   Peds  Hematology   Anesthesia Other Findings   Reproductive/Obstetrics                             Anesthesia Physical Anesthesia Plan  ASA: II  Anesthesia Plan: Spinal   Post-op Pain Management:    Induction:   Airway Management Planned:   Additional Equipment:   Intra-op Plan:   Post-operative Plan:   Informed Consent: I have reviewed the patients History and Physical, chart, labs and discussed the procedure including the risks, benefits and alternatives for the proposed anesthesia with the patient or authorized representative who has indicated his/her understanding and acceptance.     Plan Discussed with: CRNA  Anesthesia Plan Comments:         Anesthesia Quick Evaluation  

## 2016-03-28 NOTE — Transfer of Care (Signed)
Immediate Anesthesia Transfer of Care Note  Patient: Allison Manning  Procedure(s) Performed: Procedure(s): REPEAT CESAREAN SECTION WITH BILATERAL TUBAL LIGATION (Bilateral)  Patient Location: PACU  Anesthesia Type:Spinal  Level of Consciousness: awake, alert  and oriented  Airway & Oxygen Therapy: Patient Spontanous Breathing and Patient connected to nasal cannula oxygen  Post-op Assessment: Report given to RN and Post -op Vital signs reviewed and stable  Post vital signs: Reviewed and stable  Last Vitals:  Filed Vitals:   03/28/16 0910 03/28/16 0913  BP: 119/75 119/75  Pulse: 82 88  Temp: 36.3 C 36.3 C  Resp: 14 14    Last Pain:  Filed Vitals:   03/28/16 0914  PainSc: 1          Complications: No apparent anesthesia complications

## 2016-03-28 NOTE — Anesthesia Procedure Notes (Addendum)
Date/Time: 03/28/2016 7:35 AM Performed by: Ginger CarneMICHELET, STEPHANIE Pre-anesthesia Checklist: Patient identified, Emergency Drugs available, Suction available, Patient being monitored and Timeout performed Patient Re-evaluated:Patient Re-evaluated prior to inductionOxygen Delivery Method: Nasal cannula   Spinal Patient location during procedure: OR Staffing Anesthesiologist: Berdine AddisonHOMAS, Dakisha Schoof Performed by: anesthesiologist  Preanesthetic Checklist Completed: patient identified, site marked, surgical consent, pre-op evaluation, timeout performed, IV checked and risks and benefits discussed Spinal Block Patient position: sitting Prep: Betadine Patient monitoring: heart rate, cardiac monitor, continuous pulse ox and blood pressure Approach: midline Location: L3-4 Injection technique: single-shot Needle Needle type: Pencil-Tip  Needle gauge: 25 G Needle length: 9 cm Assessment Sensory level: T10 Additional Notes Marcaine 1.356ml.

## 2016-03-28 NOTE — Op Note (Addendum)
Cesarean Section with Bilateral Tubal Ligation Procedure Note  Indications: complete placenta previa and previous uterine incision low transverse, multiparous desiring permanent sterilization  Pre-operative Diagnosis: 36 week 1 day pregnancy, complete placenta previa, prior C_section x 1, gestational HTN, IUGR, multiparity desiring permanent sterilization.  Post-operative Diagnosis: Same  Procedure: Repeat low transverse C-section, bilateral tubal ligation, escharotomy  Surgeon: Hildred LaserAnika Jaidev Sanger, MD  Assistants: Sharon SellerMartin DeFrancesco, MD  Procedure: Repeat low transverse Cesarean Section  Anesthesia: Spinal anesthesia  Procedure Details: The patient was seen in the Holding Room. The risks, benefits, complications, treatment options, and expected outcomes were discussed with the patient.  The patient concurred with the proposed plan, giving informed consent.  The site of surgery properly marked. The patient was taken to the Operating Room, identified as Allison MeiersShaniqua Manning and the procedure verified as repeat C-Section Delivery with Bilateral Tubal Ligation. A Time Out was held and the above information confirmed.  After induction of anesthesia, the patient was draped and prepped in the usual sterile manner. Anesthesia was tested and noted to be adequate. A Pfannenstiel incision was made and carried down through the subcutaneous tissue to the fascia. Fascial incision was made and extended transversely. The fascia was separated from the underlying rectus tissue superiorly and inferiorly. The peritoneum was identified and entered. Peritoneal incision was extended longitudinally. There was an adhesion band of the peritoneum to the anterior surface of the uterus, which was lysed using the bovie.  The utero-vesical peritoneal reflection was incised transversely and the bladder flap was bluntly freed from the lower uterine segment. A low transverse uterine incision was made. Delivered from cephalic presentation  was a 2330 gram Female with Apgar scores of 9 at one minute and 9 at five minutes. After the umbilical cord was clamped and cut, cord blood was obtained for evaluation. The placenta was removed intact and appeared normal. The uterus was exteriorized and cleared of all clots and debris. The uterine outline, tubes and ovaries appeared normal.  The uterine incision was closed with a running locked suture of 0-Vicryl.  Hemostasis was observed.   Attention was then turned to the fallopian tubes, and where the patient's right fallopian tube was identified and grasped with a Babcock clamp.  The tube was then followed out to the vimbria.  The Babcock clamp was then used to grasp the tube approximately 4 cm from the cornual region.  A 3 cm segment of tube was then ligated with a free tie of 0-Chromic using the [place] method and excised.  The left fallopian tube was then ligated in a similar fashion and excised. The tubal lumens were cauterized bilaterally.  There was oozing noted from the left mesosalpinx.  The mesosalpinx was approximated with a 3-0 Vicryl in a running fashion. Good hemostasis was then achieved with bilateral fallopian tubes. The uterus was then returned to the abdomen.   Lavage was carried out until clear. The fascia was then reapproximated with a running suture of 1-0 Vicryl.  The prior surgical scar was grasped with Alice clamps.  The scar was excised using a knife. The subcutaneous fat layer was reapproximated with 3-0 Vicryl. The skin was reapproximated with 4-0 Monocryl.  The incision was injected with 1 ml of Kenalog.    Instrument, sponge, and needle counts were correct prior the abdominal closure and at the conclusion of the case.   Findings: Female infant, cephalic presentation, 2330 grams, with Apgar scores of 9 at one minute and 9 at five minutes. Intact placenta with 3  vessel cord.  The uterine outline, tubes and ovaries appeared normal.  Small adhesion band of peritoneum to anterior  surface of uterus.   Estimated Blood Loss:  500 ml      Drains: foley catheter to gravity drainage, 150 ml clear urine at end of the procedure         Total IV Fluids:  1400 ml  Specimens: Segments of left and right tubes.         Implants: None         Complications:  None; patient tolerated the procedure well.         Disposition: PACU - hemodynamically stable.         Condition: stable    Hildred LaserAnika Leston Schueller, MD [facility]

## 2016-03-29 ENCOUNTER — Encounter: Payer: Self-pay | Admitting: Obstetrics and Gynecology

## 2016-03-29 LAB — CBC
HEMATOCRIT: 22.9 % — AB (ref 35.0–47.0)
HEMOGLOBIN: 7.9 g/dL — AB (ref 12.0–16.0)
MCH: 31.6 pg (ref 26.0–34.0)
MCHC: 34.5 g/dL (ref 32.0–36.0)
MCV: 91.7 fL (ref 80.0–100.0)
Platelets: 232 10*3/uL (ref 150–440)
RBC: 2.5 MIL/uL — AB (ref 3.80–5.20)
RDW: 13.2 % (ref 11.5–14.5)
WBC: 13.6 10*3/uL — ABNORMAL HIGH (ref 3.6–11.0)

## 2016-03-29 LAB — SURGICAL PATHOLOGY

## 2016-03-29 NOTE — Anesthesia Postprocedure Evaluation (Signed)
Anesthesia Post Note  Patient: Allison MeiersShaniqua Fairbank  Procedure(s) Performed: Procedure(s) (LRB): REPEAT CESAREAN SECTION WITH BILATERAL TUBAL LIGATION (Bilateral)  Patient location during evaluation: [facility] Anesthesia Type: Regional Level of consciousness: awake and alert and oriented Pain management: pain level controlled Vital Signs Assessment: post-procedure vital signs reviewed and stable Respiratory status: spontaneous breathing Cardiovascular status: blood pressure returned to baseline Postop Assessment: no headache, no backache and no signs of nausea or vomiting Anesthetic complications: no Comments: C/o mild itching    Last Vitals:  Filed Vitals:   03/29/16 0054 03/29/16 0504  BP: 127/78 129/90  Pulse: 92 98  Temp: 36.7 C 37.1 C  Resp: 20 20    Last Pain:  Filed Vitals:   03/29/16 0504  PainSc: 0-No pain                 Vernie MurdersPope,  Madoline Bhatt G

## 2016-03-29 NOTE — Anesthesia Post-op Follow-up Note (Signed)
  Anesthesia Pain Follow-up Note  Patient: Allison MeiersShaniqua Manning  Day #: 1  Date of Follow-up: 03/29/2016 Time: 7:40 AM  Last Vitals:  Filed Vitals:   03/29/16 0054 03/29/16 0504  BP: 127/78 129/90  Pulse: 92 98  Temp: 36.7 C 37.1 C  Resp: 20 20    Level of Consciousness: alert  Pain: mild   Side Effects:Pruritis  Catheter Site Exam:clean, dry, no drainage     Plan: Continue current therapy  Vernie MurdersPope,  Avree Szczygiel G

## 2016-03-29 NOTE — Progress Notes (Signed)
Postpartum Day # 1: Repeat Cesarean Delivery with BTL  Subjective: Patient reports tolerating PO and no problems voiding.  Denies nausea/vomiting, fevers, chills. No flatus passed.  Objective: Vital signs in last 24 hours: Temp:  [97.3 F (36.3 C)-98.9 F (37.2 C)] 98.9 F (37.2 C) (07/07 0755) Pulse Rate:  [79-98] 96 (07/07 0755) Resp:  [14-20] 18 (07/07 0755) BP: (114-133)/(65-93) 127/92 mmHg (07/07 0755) SpO2:  [98 %-100 %] 100 % (07/07 0755)  Physical Exam:  General: alert and no distress Lungs: clear to auscultation bilaterally Breasts: normal appearance, no masses or tenderness Heart: regular rate and rhythm, S1, S2 normal, no murmur, click, rub or gallop Pelvis: Lochia appropriate, Uterine Fundus firm, Incision: bandage with moderate old dry blood. Extremities: DVT Evaluation: Negative Homan's sign. No cords or calf tenderness.  No significant calf/ankle edema.   Recent Labs  03/27/16 1445 03/29/16 0531  HGB 11.2* 7.9*  HCT 32.5* 22.9*    Assessment/Plan: Status post Cesarean section. Doing well postoperatively.  Anemia due to routine surgical blood loss.  Asymptomatic.  Will treat with iron PO.  Will use breast pump and supplement with formula Circumcision after discharge  Contraception BTL Advance diet Continue PO pain management Gestational HTN - few mild elevations, does not require treatment at this time. Will continue to monitor.  Continue current care. Dispo: likely d/c home in 1-2 days.   Hildred LaserAnika Shalika Arntz [facility]

## 2016-03-30 MED ORDER — IBUPROFEN 800 MG PO TABS: 800.0000 mg | ORAL_TABLET | Freq: Three times a day (TID) | ORAL | Status: DC | PRN

## 2016-03-30 MED ORDER — FERROUS SULFATE 325 (65 FE) MG PO TABS: 325.0000 mg | ORAL_TABLET | Freq: Three times a day (TID) | ORAL | Status: DC

## 2016-03-30 MED ORDER — DOCUSATE SODIUM 100 MG PO CAPS: 100.0000 mg | ORAL_CAPSULE | Freq: Two times a day (BID) | ORAL | Status: DC | PRN

## 2016-03-30 MED ORDER — HYDROMORPHONE HCL 2 MG PO TABS
2.0000 mg | ORAL_TABLET | Freq: Four times a day (QID) | ORAL | Status: DC | PRN
Start: 2016-03-30 — End: 2016-04-11

## 2016-03-30 NOTE — Progress Notes (Signed)
Postpartum Day # 2: Repeat Cesarean Delivery with BTL  Subjective: Patient reports tolerating PO and no problems voiding.  Ambulating without difficulty.Denies nausea/vomiting, fevers, chills. + flatus.  Objective: Vital signs in last 24 hours: Temp:  [98 F (36.7 C)-99.2 F (37.3 C)] 99 F (37.2 C) (07/08 0743) Pulse Rate:  [92-114] 92 (07/08 0743) Resp:  [18] 18 (07/08 0743) BP: (124-137)/(71-89) 130/84 mmHg (07/08 0743) SpO2:  [100 %] 100 % (07/08 0743)  Physical Exam:  General: alert and no distress Lungs: clear to auscultation bilaterally Breasts: normal appearance, no masses or tenderness Heart: regular rate and rhythm, S1, S2 normal, no murmur, click, rub or gallop Pelvis: Lochia appropriate, Uterine Fundus firm, Incision: old dry blood on steri-strips. Healing well, well approximated, no drainage.  Extremities: DVT Evaluation: Negative Homan's sign. No cords or calf tenderness.  No significant calf/ankle edema.   Recent Labs  03/27/16 1445 03/29/16 0531  HGB 11.2* 7.9*  HCT 32.5* 22.9*    Assessment/Plan: Status post Cesarean section. Doing well postoperatively.  Anemia due to routine surgical blood loss.  Asymptomatic.  Will treat with iron PO.  Will use breast pump and supplement with formula Circumcision after discharge  Contraception intrapartum BTL Regular diet Continue PO pain management Gestational HTN - BPs wnl.  Continue current care. Dispo: d/c home today.   Hildred LaserAnika Axelle Szwed [facility]

## 2016-03-30 NOTE — Discharge Summary (Signed)
Obstetric Discharge Summary Reason for Admission: cesarean section for complete placenta previa Prenatal Procedures: NST and ultrasound Intrapartum Procedures: cesarean: low cervical, transverse and tubal ligation Postpartum Procedures: none Complications-Operative and Postpartum: none HEMOGLOBIN  Date Value Ref Range Status  03/29/2016 7.9* 12.0 - 16.0 g/dL Final   HCT  Date Value Ref Range Status  03/29/2016 22.9* 35.0 - 47.0 % Final   HEMATOCRIT  Date Value Ref Range Status  01/02/2016 33.7* 34.0 - 46.6 % Final    Physical Exam:  General: alert and no distress Lochia: appropriate Uterine Fundus: firm Incision: healing well, no significant drainage, no dehiscence, no significant erythema DVT Evaluation: Negative Homan's sign. No cords or calf tenderness. No significant calf/ankle edema.  Discharge Diagnoses: Preterm pregnancy with complete previa, h/o prior C-section x 1 - delivered.  Anemia.   Discharge Information: Date: 03/30/2016 Activity: pelvic rest Diet: routine Medications: PNV, Ibuprofen, Colace, Iron and Dilaudid Condition: stable Instructions: refer to practice specific booklet Discharge to: home Follow-up Information    Follow up with Allison LaserAnika Virgal Warmuth, MD In 1 week.   Specialties:  Obstetrics and Gynecology, Radiology   Why:  For wound re-check   Contact information:   [address] [place] [address] [phone]       Newborn Data: Live born female  Birth Weight: 5 lb 2.2 oz (2330 g) APGAR: 9, 9  Home with mother.  Allison Manning 03/30/2016, 11:31 AM

## 2016-03-30 NOTE — Progress Notes (Signed)
D/C order from MD.  Reviewed d/c instructions and prescriptions with patient and answered any questions.  Patient d/c home with infant via wheelchair by nursing/auxillary. 

## 2016-04-10 ENCOUNTER — Encounter: Payer: Medicaid Other | Admitting: Obstetrics and Gynecology

## 2016-04-11 ENCOUNTER — Encounter: Payer: Self-pay | Admitting: Obstetrics and Gynecology

## 2016-04-11 ENCOUNTER — Ambulatory Visit (INDEPENDENT_AMBULATORY_CARE_PROVIDER_SITE_OTHER): Payer: Medicaid Other | Admitting: Obstetrics and Gynecology

## 2016-04-11 VITALS — BP 134/89 | HR 88 | Ht 65.0 in | Wt 151.0 lb

## 2016-04-11 DIAGNOSIS — Z9889 Other specified postprocedural states: Secondary | ICD-10-CM

## 2016-04-11 DIAGNOSIS — O133 Gestational [pregnancy-induced] hypertension without significant proteinuria, third trimester: Secondary | ICD-10-CM

## 2016-04-11 DIAGNOSIS — Z8759 Personal history of other complications of pregnancy, childbirth and the puerperium: Secondary | ICD-10-CM

## 2016-04-11 DIAGNOSIS — Z98891 History of uterine scar from previous surgery: Secondary | ICD-10-CM

## 2016-04-12 NOTE — Progress Notes (Signed)
   OBSTETRIC POST-OPERATIVE CLINIC VISIT  Subjective:     Allison MeiersShaniqua Manning is a 30 y.o. [phone] female who presents to the clinic 2 weeks status post repeat low transverse C-section with BTL for complete placenta previa and h/o prior C-section x 1.  Also with GHTN and IUGR. C-section was performed at [redacted] weeks gestation.  Eating a regular diet without difficulty. Bowel movements are normal. Pain is controlled with current analgesics. Medications being used: prescription NSAID's including ibuprofen (Motrin) and narcotic analgesics including oxycodone/acetaminophen (Percocet, Tylox).  The following portions of the patient's history were reviewed and updated as appropriate: allergies, current medications, past family history, past medical history, past social history, past surgical history and problem list.  Review of Systems A comprehensive review of systems was negative.    Objective:    BP 134/89 mmHg  Pulse 88  Ht 5\' 5"  (1.651 m)  Wt 151 lb (68.493 kg)  BMI 25.13 kg/m2  LMP 06/27/2015 (LMP Unknown)  Breastfeeding? No General:  alert and no distress  Abdomen: soft, bowel sounds active, non-tender  Incision:   healing well, no drainage, no erythema, no hernia, no seroma, no swelling, no dehiscence, incision well approximated     Assessment:   Doing well postoperatively. S/p repeat C-section with BTL Gestational HTN in most recent pregnancy with h/o pre-eclampsia in prior pregnancy Plan:   1. Continue any current medications. 2. Wound care discussed. 3. Activity restrictions: no bending, stooping, or squatting, no lifting more than 15 pounds and pelvic rest x 4 weeks 4. Pathology report discussed 5.  BPs wnl today.  [facility] to [facility] or s/s of pre-eclampsia in postpartum period.  6. Anticipated return to work: not applicable. 7. Follow up: 4 weeks for postpartum visit.     Hildred LaserAnika Mansi Tokar, MD [facility]    .

## 2016-04-15 ENCOUNTER — Encounter: Payer: Medicaid Other | Admitting: Obstetrics and Gynecology

## 2016-05-09 ENCOUNTER — Ambulatory Visit: Payer: Medicaid Other | Admitting: Obstetrics and Gynecology

## 2017-04-08 ENCOUNTER — Encounter: Payer: Medicaid Other | Admitting: Obstetrics and Gynecology

## 2017-11-15 IMAGING — US US OB LIMITED
1 series · 14 of 26 positions shown · non-contrast
Comparison: none

CLINICAL DATA: Third trimester vaginal bleeding.  Placenta previa.

EXAM:
LIMITED OBSTETRIC ULTRASOUND

[Series 1: us ob limited · 0.17mm/px · 14 of 26 slices shown]
[im 1/26]
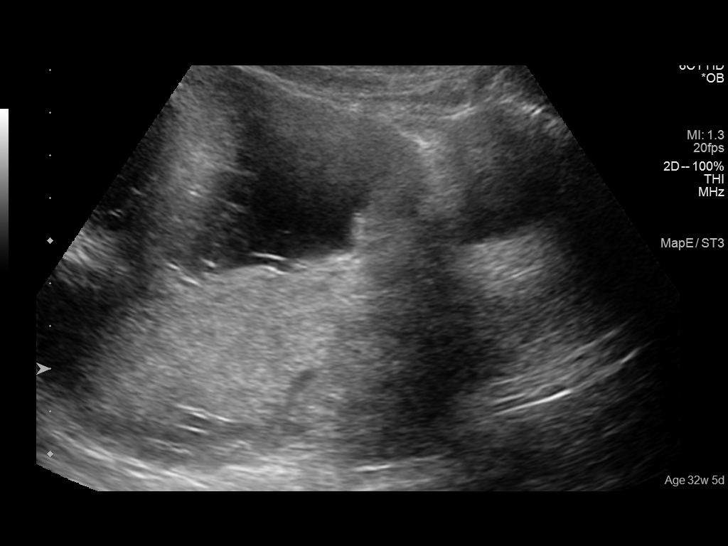
[im 3/26]
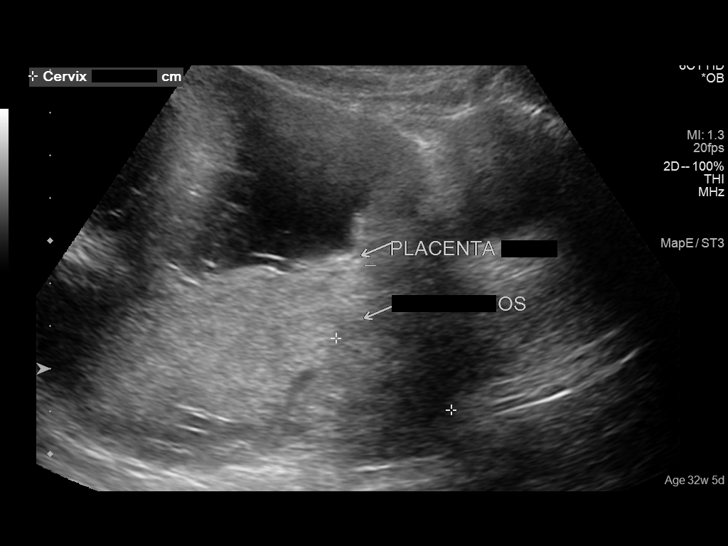
[im 5/26]
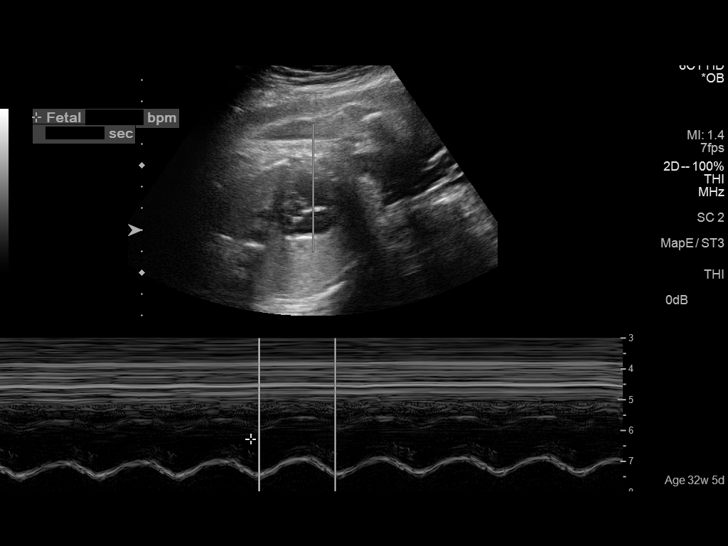
[im 7/26]
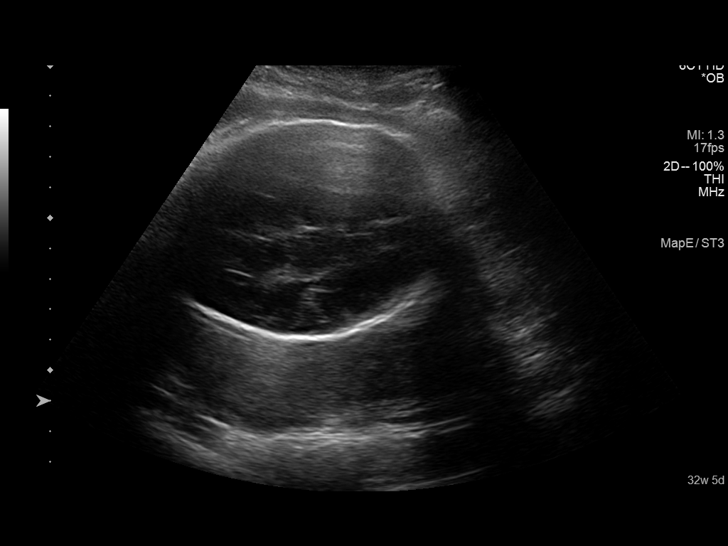
[im 9/26]
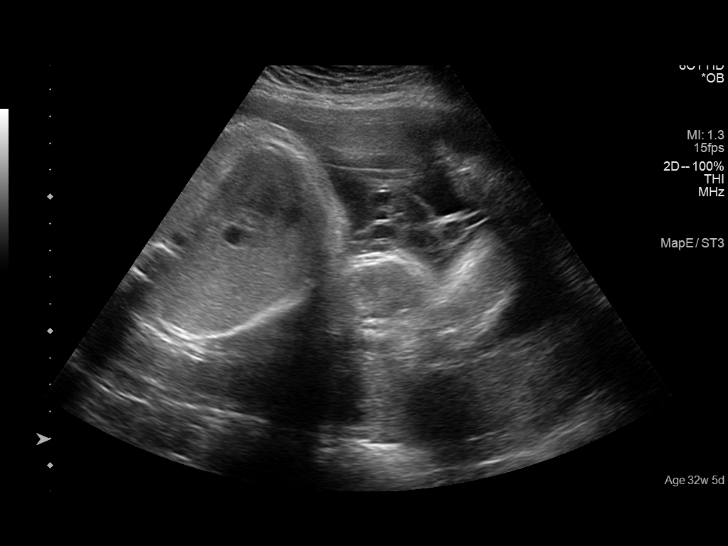
[im 11/26]
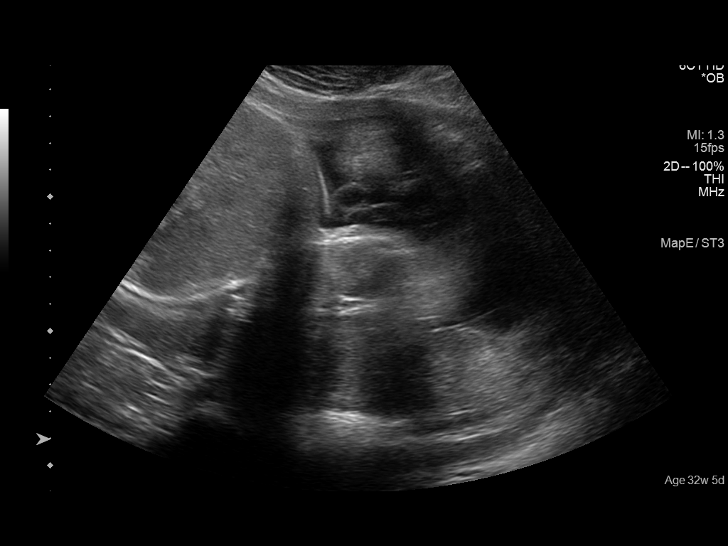
[im 13/26]
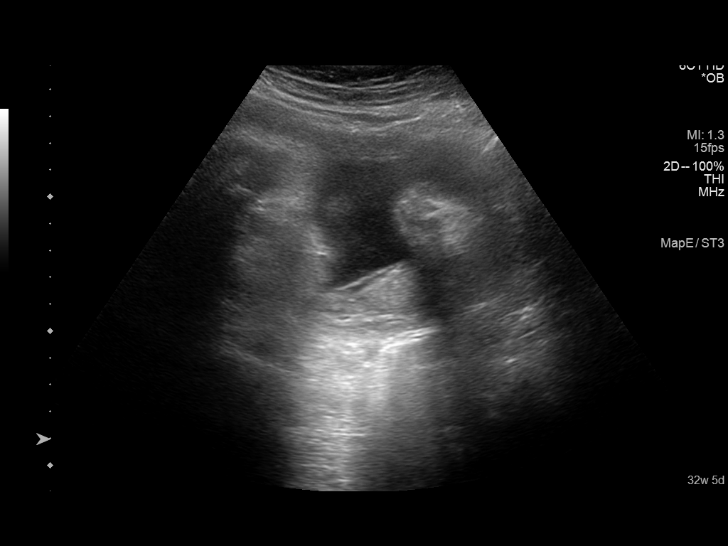
[im 14/26]
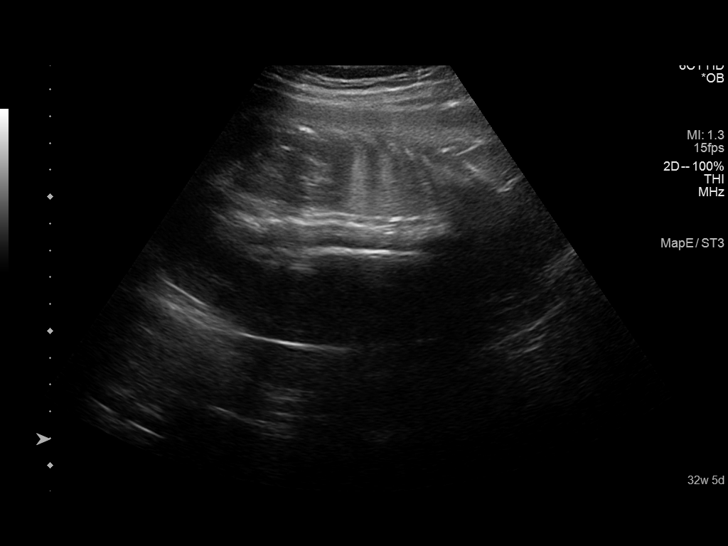
[im 16/26]
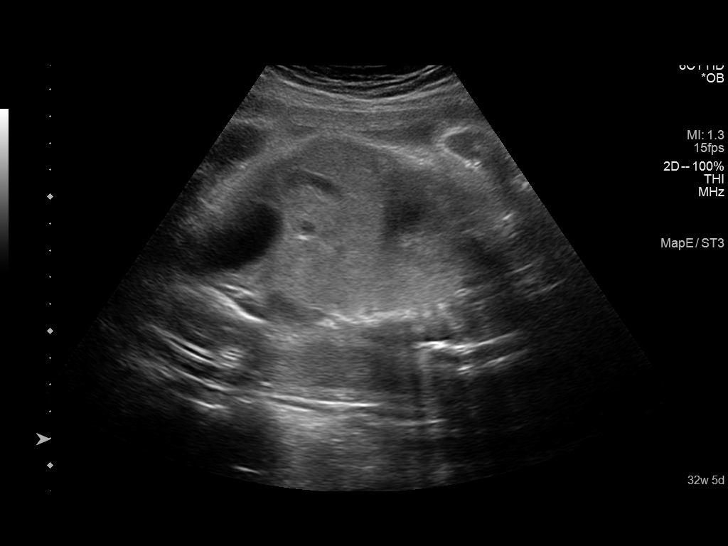
[im 18/26]
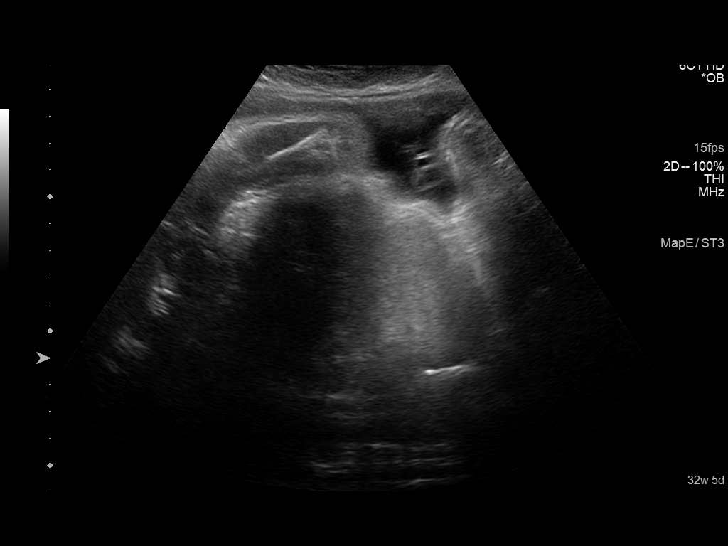
[im 20/26]
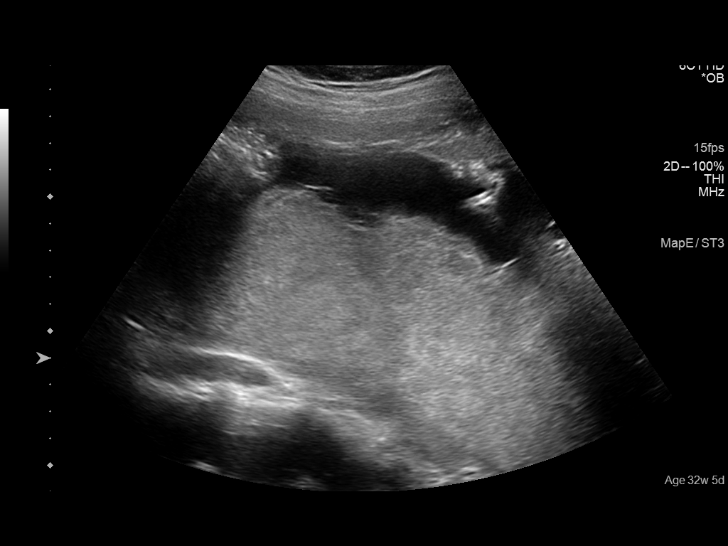
[im 22/26]
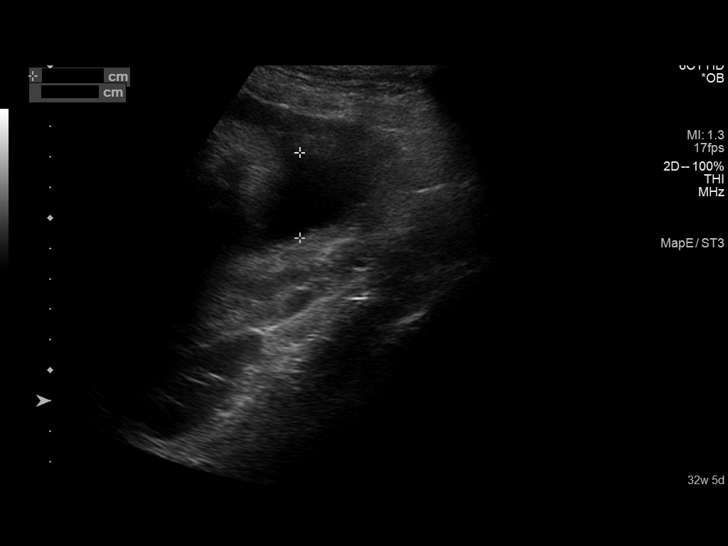
[im 24/26]
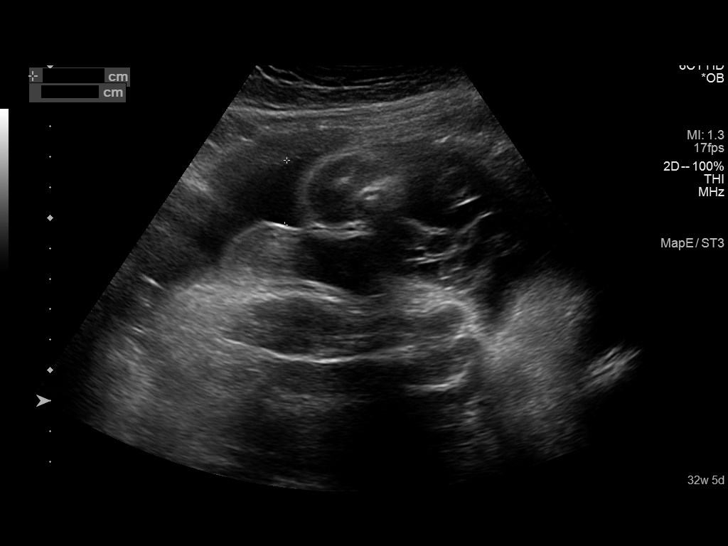
[im 26/26]
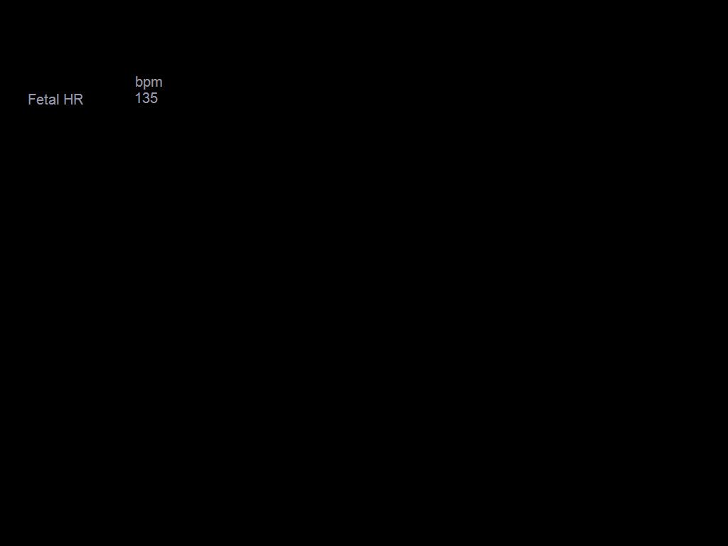

[14 of 26 positions shown; findings below may reference images not displayed]

FINDINGS: Number of Fetuses:  Single

Heart Rate:  135 bpm

Movement:  Present

Presentation: Cephalic

Placental Location: Posterior

Previa: Complete

Amniotic Fluid (Subjective):  Within normal limits.

BPD:  7.0cm 28w  0d

MATERNAL FINDINGS:

Cervix:  3.2 cm Appears closed.

Uterus/Adnexae:  No abnormality visualized.
IMPRESSION: Single viable intrauterine pregnancy at 28 weeks 0 days. Complete
placenta previa. No evidence of abruption.

This exam is performed on an emergent basis and does not
comprehensively evaluate fetal size, dating, or anatomy; follow-up
complete OB US should be considered if further fetal assessment is
warranted.

## 2020-07-07 ENCOUNTER — Encounter: Payer: Self-pay | Admitting: Obstetrics and Gynecology
